# Patient Record
Sex: Female | Born: 1960 | Race: White | Hispanic: No | Marital: Married | Smoking: Former smoker
Health system: Southern US, Community
[De-identification: ages and names within clinical notes are randomized; demographics above are authoritative.]

## PROBLEM LIST (undated history)

## (undated) DIAGNOSIS — M1990 Unspecified osteoarthritis, unspecified site: Secondary | ICD-10-CM

## (undated) DIAGNOSIS — F99 Mental disorder, not otherwise specified: Secondary | ICD-10-CM

## (undated) DIAGNOSIS — R51 Headache: Secondary | ICD-10-CM

## (undated) DIAGNOSIS — K219 Gastro-esophageal reflux disease without esophagitis: Secondary | ICD-10-CM

## (undated) DIAGNOSIS — E119 Type 2 diabetes mellitus without complications: Secondary | ICD-10-CM

## (undated) DIAGNOSIS — F419 Anxiety disorder, unspecified: Secondary | ICD-10-CM

## (undated) DIAGNOSIS — E039 Hypothyroidism, unspecified: Secondary | ICD-10-CM

## (undated) HISTORY — PX: KNEE ARTHROSCOPY: SUR90

## (undated) HISTORY — PX: JOINT REPLACEMENT: SHX530

## (undated) HISTORY — PX: DILATION AND CURETTAGE OF UTERUS: SHX78

## (undated) HISTORY — PX: OTHER SURGICAL HISTORY: SHX169

---

## 2005-04-26 ENCOUNTER — Ambulatory Visit: Payer: Self-pay | Admitting: Obstetrics & Gynecology

## 2005-05-12 ENCOUNTER — Ambulatory Visit: Payer: Self-pay

## 2006-01-15 ENCOUNTER — Emergency Department: Payer: Self-pay | Admitting: Emergency Medicine

## 2006-01-16 ENCOUNTER — Emergency Department: Payer: Self-pay | Admitting: Emergency Medicine

## 2006-04-17 ENCOUNTER — Ambulatory Visit: Payer: Self-pay | Admitting: Internal Medicine

## 2006-11-21 ENCOUNTER — Ambulatory Visit: Payer: Self-pay | Admitting: General Practice

## 2006-12-20 ENCOUNTER — Ambulatory Visit: Payer: Self-pay | Admitting: Orthopaedic Surgery

## 2007-04-17 ENCOUNTER — Ambulatory Visit: Payer: Self-pay | Admitting: Internal Medicine

## 2007-04-18 ENCOUNTER — Ambulatory Visit: Payer: Self-pay | Admitting: Internal Medicine

## 2007-07-06 ENCOUNTER — Emergency Department: Payer: Self-pay | Admitting: Emergency Medicine

## 2007-10-23 ENCOUNTER — Ambulatory Visit: Payer: Self-pay | Admitting: Internal Medicine

## 2008-12-16 ENCOUNTER — Ambulatory Visit: Payer: Self-pay | Admitting: Internal Medicine

## 2009-04-19 ENCOUNTER — Ambulatory Visit: Payer: Self-pay | Admitting: Internal Medicine

## 2010-01-05 ENCOUNTER — Ambulatory Visit: Payer: Self-pay | Admitting: Internal Medicine

## 2010-04-08 ENCOUNTER — Encounter: Admission: RE | Admit: 2010-04-08 | Discharge: 2010-04-08 | Payer: Self-pay | Admitting: Orthopedic Surgery

## 2010-06-26 ENCOUNTER — Emergency Department: Payer: Self-pay | Admitting: Emergency Medicine

## 2010-07-01 ENCOUNTER — Encounter: Admission: RE | Admit: 2010-07-01 | Discharge: 2010-07-01 | Payer: Self-pay | Admitting: Orthopedic Surgery

## 2010-10-24 ENCOUNTER — Ambulatory Visit: Payer: Self-pay | Admitting: Orthopedic Surgery

## 2010-10-31 ENCOUNTER — Inpatient Hospital Stay: Payer: Self-pay | Admitting: Orthopedic Surgery

## 2010-11-01 LAB — PATHOLOGY REPORT

## 2010-11-14 ENCOUNTER — Ambulatory Visit: Payer: Self-pay | Admitting: Internal Medicine

## 2010-12-15 ENCOUNTER — Ambulatory Visit: Payer: Self-pay | Admitting: Orthopedic Surgery

## 2011-07-18 ENCOUNTER — Ambulatory Visit: Payer: Self-pay | Admitting: Internal Medicine

## 2011-10-13 ENCOUNTER — Emergency Department: Payer: Self-pay | Admitting: Emergency Medicine

## 2012-03-13 ENCOUNTER — Ambulatory Visit: Payer: Self-pay

## 2012-03-20 ENCOUNTER — Ambulatory Visit: Payer: Self-pay | Admitting: Surgery

## 2012-05-29 ENCOUNTER — Ambulatory Visit: Payer: Self-pay | Admitting: Internal Medicine

## 2012-05-29 LAB — CBC CANCER CENTER
Basophil: 4 %
HCT: 41 % (ref 35.0–47.0)
MCH: 29.4 pg (ref 26.0–34.0)
MCHC: 32.2 g/dL (ref 32.0–36.0)
MCV: 91 fL (ref 80–100)
Monocytes: 7 %
Platelet: 94 x10 3/mm — ABNORMAL LOW (ref 150–440)
RDW: 14.6 % — ABNORMAL HIGH (ref 11.5–14.5)
WBC: 4.5 x10 3/mm (ref 3.6–11.0)

## 2012-05-29 LAB — URINALYSIS, COMPLETE
Ketone: NEGATIVE
Leukocyte Esterase: NEGATIVE
Nitrite: NEGATIVE
Ph: 5 (ref 4.5–8.0)
Protein: NEGATIVE
RBC,UR: <1 /HPF (ref 0–5)
Specific Gravity: 1.021 (ref 1.003–1.030)
Squamous Epithelial: <1

## 2012-05-29 LAB — SEDIMENTATION RATE: Erythrocyte Sed Rate: 15 mm/hr (ref 0–30)

## 2012-05-29 LAB — IRON AND TIBC
Iron Bind.Cap.(Total): 418 ug/dL (ref 250–450)
Iron Saturation: 18 %
Iron: 77 ug/dL (ref 50–170)
Unbound Iron-Bind.Cap.: 341 ug/dL

## 2012-05-29 LAB — HEPATIC FUNCTION PANEL A (ARMC)
Albumin: 4.3 g/dL (ref 3.4–5.0)
Bilirubin,Total: 0.3 mg/dL (ref 0.2–1.0)
SGOT(AST): 28 U/L (ref 15–37)
SGPT (ALT): 44 U/L
Total Protein: 8 g/dL (ref 6.4–8.2)

## 2012-05-29 LAB — CREATININE, SERUM
EGFR (African American): >60
EGFR (Non-African Amer.): >60

## 2012-05-29 LAB — APTT: Activated PTT: 28.6 secs (ref 23.6–35.9)

## 2012-05-29 LAB — PROTIME-INR
INR: 1
Prothrombin Time: 13.4 secs (ref 11.5–14.7)

## 2012-05-29 LAB — LACTATE DEHYDROGENASE: LDH: 165 U/L (ref 84–246)

## 2012-05-29 LAB — RETICULOCYTES
Absolute Retic Count: 0.0484 10*6/uL (ref 0.024–0.084)
Reticulocyte: 1.08 % (ref 0.5–1.5)

## 2012-05-31 LAB — PROT IMMUNOELECTROPHORES(ARMC)

## 2012-06-05 LAB — CBC CANCER CENTER
Eosinophil: 5 %
HCT: 40.4 % (ref 35.0–47.0)
Lymphocytes: 33 %
MCV: 92 fL (ref 80–100)
Monocytes: 4 %
Platelet: 79 x10 3/mm — ABNORMAL LOW (ref 150–440)
Segmented Neutrophils: 57 %
WBC: 4.1 x10 3/mm (ref 3.6–11.0)

## 2012-06-05 LAB — RETICULOCYTES
Absolute Retic Count: 0.0413 10*6/uL (ref 0.024–0.084)
Reticulocyte: 0.94 % (ref 0.5–1.5)

## 2012-06-27 ENCOUNTER — Ambulatory Visit: Payer: Self-pay | Admitting: Internal Medicine

## 2012-06-28 ENCOUNTER — Ambulatory Visit: Payer: Self-pay | Admitting: Gastroenterology

## 2012-11-25 ENCOUNTER — Encounter (HOSPITAL_COMMUNITY): Payer: Self-pay | Admitting: Pharmacy Technician

## 2012-11-26 DIAGNOSIS — M47812 Spondylosis without myelopathy or radiculopathy, cervical region: Secondary | ICD-10-CM | POA: Diagnosis present

## 2012-11-26 NOTE — H&P (Signed)
History of Present Illness The patient is a 51 year old female who presents today for follow up of their neck. The patient is being followed for their right-sided neck pain. Symptoms reported today include: pain, aching, stiffness and burning. The patient feels that they are doing poorly and report their pain level to be moderate. The following medication has been used for pain control: Percocet. The patient presents today following a nerve block. 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    Subjective Transcription  Dawn Dean presents today forward flexion. The injection that we did in November is beginning to wear off and her quality of life continues to deteriorate. The patient had an excellent response to the recent right C6 nerve root block confirming that this is the pain generator. The MRI done in July confirms the foraminal stenosis and hard disk osteophyte going to the right C5-6 with C6 nerve compression and this is confirmed by the positive result of the injection. The patient has had this issue now for 2-1/2 almost three years. Despite injection therapy, physical therapy, activity modification and medications the patient's quality of life continues to suffer and deteriorate.    Allergies No Known Drug Allergies. 05/21/2012   Medication History Robaxin (500MG  Tablet, 1 (one) Oral qhs, Taken starting 09/03/2012) Active. NexIUM ( Oral) Specific dose unknown - Active. Levothyroxine Sodium ( Oral) Specific dose unknown - Active. Atorvastatin Calcium ( Oral) Specific dose unknown - Active. Citalopram Hydrobromide (40MG  Tablet, Oral) Active. (60mg ) Aleve (220MG  Capsule, Oral) Active. MetFORMIN HCl (1000MG  Tablet, Oral) Active. Xanax (0.5MG  Tablet, Oral) Active. Apidra OptiClik (100UNIT/ML Solution, Subcutaneous) Active. Vitamin B 12 ( Lozenge, Oral)  Active. Percocet (10-325MG  Tablet, Oral) Active. Proventil HFA (108 (90 Base)MCG/ACT Aerosol Soln, Inhalation) Active. Polyethylene Glycol 3350 ( Oral) Active. NexIUM (40MG  Capsule DR, Oral) Active. Levothyroxine Sodium ( Tablet, Oral) Active. Fluconazole (100MG  Tablet, Oral) Active. ALPRAZolam (1MG  Tablet, Oral) Active. Amoxicillin-Pot Clavulanate (875-125MG  Tablet, Oral) Active. [employer] ( In Vitro) Active. Glimepiride (4MG  Tablet, Oral) Active. PEG 3350/Electrolytes (240GM For Solution, Oral) Active.   Objective Transcription  Clinically the patient still has dysesthesias in a C6 distribution on the right. Five out of five motor strength. One plus symmetrical deep tendon reflexes.    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 We discussed treatment plans and she would like to proceed with an ACDF. I think this is the best option given the duration of her symptoms, the failure of conservative management and the confirmatory injection that she had. The risks of surgery include infection, bleeding, nerve damage, death, stroke, paralysis, failure to heal, need for further surgery, ongoing or worse pain,  loss of bowel or bladder control, throat pain, swallowing difficulties, hoarseness in the voice, nonunion meaning it does not fuse, or the levels above and below can break down.    Plans Transcription  At this point, I have gone over the procedure with the patient and she has expressed an understanding of the procedure. All of her questions were addressed. We will plan on proceeding with the ACDF some point in early January.  Patient cleared for surgery medically by Dr Alyson Reedy (PCP)

## 2012-11-28 ENCOUNTER — Ambulatory Visit: Payer: Self-pay | Admitting: Internal Medicine

## 2012-12-04 ENCOUNTER — Encounter (HOSPITAL_COMMUNITY): Payer: Self-pay

## 2012-12-04 ENCOUNTER — Encounter (HOSPITAL_COMMUNITY)
Admission: RE | Admit: 2012-12-04 | Discharge: 2012-12-04 | Disposition: A | Discharge status: Home / Self Care | Payer: Worker's Compensation | Source: Ambulatory Visit | Attending: Anesthesiology | Admitting: Anesthesiology

## 2012-12-04 ENCOUNTER — Encounter (HOSPITAL_COMMUNITY)
Admission: RE | Admit: 2012-12-04 | Discharge: 2012-12-04 | Disposition: A | Discharge status: Home / Self Care | Payer: Worker's Compensation | Source: Ambulatory Visit | Attending: Orthopedic Surgery | Admitting: Orthopedic Surgery

## 2012-12-04 HISTORY — DX: Anxiety disorder, unspecified: F41.9

## 2012-12-04 HISTORY — DX: Hypothyroidism, unspecified: E03.9

## 2012-12-04 HISTORY — DX: Headache: R51

## 2012-12-04 HISTORY — DX: Gastro-esophageal reflux disease without esophagitis: K21.9

## 2012-12-04 HISTORY — DX: Type 2 diabetes mellitus without complications: E11.9

## 2012-12-04 HISTORY — DX: Mental disorder, not otherwise specified: F99

## 2012-12-04 LAB — CBC
MCH: 30.3 pg (ref 26.0–34.0)
MCHC: 33.5 g/dL (ref 30.0–36.0)
MCV: 90.4 fL (ref 78.0–100.0)
Platelets: 113 10*3/uL — ABNORMAL LOW (ref 150–400)
RBC: 4.46 MIL/uL (ref 3.87–5.11)
RDW: 14.1 % (ref 11.5–15.5)
WBC: 6.1 10*3/uL (ref 4.0–10.5)

## 2012-12-04 LAB — BASIC METABOLIC PANEL
BUN: 16 mg/dL (ref 6–23)
GFR calc Af Amer: 56 mL/min — ABNORMAL LOW (ref >90)
GFR calc non Af Amer: 48 mL/min — ABNORMAL LOW (ref >90)
Glucose, Bld: 218 mg/dL — ABNORMAL HIGH (ref 70–99)
Sodium: 137 mEq/L (ref 135–145)

## 2012-12-04 LAB — HCG, SERUM, QUALITATIVE: Preg, Serum: NEGATIVE

## 2012-12-04 MED ORDER — CEFAZOLIN SODIUM-DEXTROSE 2-3 GM-% IV SOLR
2.0000 g | INTRAVENOUS | Status: AC
  Administered 2012-12-05: 2 g via INTRAVENOUS
  Filled 2012-12-04: qty 50

## 2012-12-04 MED ORDER — ACETAMINOPHEN 10 MG/ML IV SOLN
1000.0000 mg | Freq: Once | INTRAVENOUS | Status: DC
  Filled 2012-12-04: qty 100

## 2012-12-04 MED ORDER — DEXAMETHASONE SODIUM PHOSPHATE 4 MG/ML IJ SOLN
4.0000 mg | Freq: Once | INTRAMUSCULAR | Status: DC
  Filled 2012-12-04: qty 1

## 2012-12-04 NOTE — Progress Notes (Signed)
Spoke with Dr. Noreene Larsson, regarding pt. EKG (prelim. Result) done today.  We are unsure where she had or if she had last ekg.  Dr. Noreene Larsson  will review pt. In a.m.

## 2012-12-04 NOTE — Pre-Procedure Instructions (Signed)
18 Dawn Dean  12/04/2012   Your procedure is scheduled on:  Thursday, January 9th  Report to [facility] at 0530 AM.  Call this number if you have problems the morning of surgery: [phone]   Remember:   Do not eat food or drink:After Midnight.   Take these medicines the morning of surgery with A SIP OF WATER: celexa, nexium, ativan if needed, percocet if needed   Do not wear jewelry, make-up or nail polish.  Do not wear lotions, powders, or perfumes. You may not wear deodorant.  Do not shave 48 hours prior to surgery. Men may shave face and neck.  Do not bring valuables to the hospital.  Contacts, dentures or bridgework may not be worn into surgery.  Leave suitcase in the car. After surgery it may be brought to your room.  For patients admitted to the hospital, checkout time is 11:00 AM the day of discharge.   Patients discharged the day of surgery will not be allowed to drive home.   Special Instructions: Shower using CHG 2 nights before surgery and the night before surgery.  If you shower the day of surgery use CHG.  Use special wash - you have one bottle of CHG for all showers.  You should use approximately 1/3 of the bottle for each shower.   Please read over the following fact sheets that you were given: Pain Booklet, Coughing and Deep Breathing, MRSA Information and Surgical Site Infection Prevention

## 2012-12-05 ENCOUNTER — Ambulatory Visit (HOSPITAL_COMMUNITY): Payer: Worker's Compensation

## 2012-12-05 ENCOUNTER — Ambulatory Visit (HOSPITAL_COMMUNITY): Payer: Worker's Compensation | Admitting: Anesthesiology

## 2012-12-05 ENCOUNTER — Encounter (HOSPITAL_COMMUNITY): Payer: Self-pay | Admitting: General Practice

## 2012-12-05 ENCOUNTER — Ambulatory Visit (HOSPITAL_COMMUNITY)
Admission: RE | Admit: 2012-12-05 | Discharge: 2012-12-06 | Disposition: A | Discharge status: Home / Self Care | Payer: Worker's Compensation | Source: Ambulatory Visit | Attending: Orthopedic Surgery | Admitting: Orthopedic Surgery

## 2012-12-05 ENCOUNTER — Encounter (HOSPITAL_COMMUNITY): Payer: Self-pay | Admitting: Anesthesiology

## 2012-12-05 ENCOUNTER — Encounter (HOSPITAL_COMMUNITY)
Admission: RE | Disposition: A | Discharge status: Home / Self Care | Payer: Self-pay | Source: Ambulatory Visit | Attending: Orthopedic Surgery

## 2012-12-05 ENCOUNTER — Encounter (HOSPITAL_COMMUNITY): Payer: Self-pay | Admitting: *Deleted

## 2012-12-05 DIAGNOSIS — Z01812 Encounter for preprocedural laboratory examination: Secondary | ICD-10-CM | POA: Insufficient documentation

## 2012-12-05 DIAGNOSIS — E119 Type 2 diabetes mellitus without complications: Secondary | ICD-10-CM | POA: Insufficient documentation

## 2012-12-05 DIAGNOSIS — Z794 Long term (current) use of insulin: Secondary | ICD-10-CM | POA: Insufficient documentation

## 2012-12-05 DIAGNOSIS — M47812 Spondylosis without myelopathy or radiculopathy, cervical region: Secondary | ICD-10-CM | POA: Insufficient documentation

## 2012-12-05 HISTORY — PX: CERVICAL DISCECTOMY: SHX98

## 2012-12-05 HISTORY — PX: ANTERIOR CERVICAL DECOMP/DISCECTOMY FUSION: SHX1161

## 2012-12-05 LAB — GLUCOSE, CAPILLARY
Glucose-Capillary: 145 mg/dL — ABNORMAL HIGH (ref 70–99)
Glucose-Capillary: 189 mg/dL — ABNORMAL HIGH (ref 70–99)
Glucose-Capillary: 247 mg/dL — ABNORMAL HIGH (ref 70–99)

## 2012-12-05 SURGERY — ANTERIOR CERVICAL DECOMPRESSION/DISCECTOMY FUSION 1 LEVEL
Anesthesia: General | Site: Spine Cervical | Wound class: Clean

## 2012-12-05 MED ORDER — SODIUM CHLORIDE 0.9 % IJ SOLN: 3.0000 mL | INTRAMUSCULAR | Status: DC | PRN

## 2012-12-05 MED ORDER — LIDOCAINE HCL 4 % MT SOLN
OROMUCOSAL | Status: DC | PRN
  Administered 2012-12-05: 4 mL via TOPICAL

## 2012-12-05 MED ORDER — LACTATED RINGERS IV SOLN
INTRAVENOUS | Status: DC | PRN
  Administered 2012-12-05 (×2): via INTRAVENOUS

## 2012-12-05 MED ORDER — THROMBIN 20000 UNITS EX SOLR
CUTANEOUS | Status: DC | PRN
  Administered 2012-12-05: 09:00:00 via TOPICAL

## 2012-12-05 MED ORDER — SODIUM CHLORIDE 0.9 % IV SOLN: 250.0000 mL | INTRAVENOUS | Status: DC

## 2012-12-05 MED ORDER — PROPOFOL 10 MG/ML IV BOLUS
INTRAVENOUS | Status: DC | PRN
  Administered 2012-12-05: 160 mg via INTRAVENOUS

## 2012-12-05 MED ORDER — ACETAMINOPHEN 10 MG/ML IV SOLN
1000.0000 mg | Freq: Four times a day (QID) | INTRAVENOUS | Status: AC
  Administered 2012-12-05 – 2012-12-06 (×4): 1000 mg via INTRAVENOUS
  Filled 2012-12-05 (×4): qty 100

## 2012-12-05 MED ORDER — OXYCODONE HCL 5 MG PO TABS
10.0000 mg | ORAL_TABLET | ORAL | Status: DC | PRN
  Administered 2012-12-05 – 2012-12-06 (×3): 10 mg via ORAL
  Filled 2012-12-05 (×3): qty 2

## 2012-12-05 MED ORDER — THROMBIN 20000 UNITS EX SOLR
CUTANEOUS | Status: DC | PRN
  Administered 2012-12-05: 20000 [IU] via TOPICAL

## 2012-12-05 MED ORDER — FENTANYL CITRATE 0.05 MG/ML IJ SOLN
INTRAMUSCULAR | Status: DC | PRN
  Administered 2012-12-05: 100 ug via INTRAVENOUS
  Administered 2012-12-05 (×3): 50 ug via INTRAVENOUS

## 2012-12-05 MED ORDER — HYDROMORPHONE HCL PF 1 MG/ML IJ SOLN
INTRAMUSCULAR | Status: AC
  Filled 2012-12-05: qty 1

## 2012-12-05 MED ORDER — GLYCOPYRROLATE 0.2 MG/ML IJ SOLN
INTRAMUSCULAR | Status: DC | PRN
  Administered 2012-12-05: 0.4 mg via INTRAVENOUS

## 2012-12-05 MED ORDER — METHOCARBAMOL 100 MG/ML IJ SOLN
500.0000 mg | Freq: Four times a day (QID) | INTRAVENOUS | Status: DC | PRN
  Administered 2012-12-05: 500 mg via INTRAVENOUS
  Filled 2012-12-05: qty 5

## 2012-12-05 MED ORDER — HYDROMORPHONE HCL PF 1 MG/ML IJ SOLN
0.2500 mg | INTRAMUSCULAR | Status: DC | PRN
Start: 2012-12-05 — End: 2012-12-05
  Administered 2012-12-05 (×2): 0.5 mg via INTRAVENOUS

## 2012-12-05 MED ORDER — DEXAMETHASONE SODIUM PHOSPHATE 10 MG/ML IJ SOLN
INTRAMUSCULAR | Status: AC
  Filled 2012-12-05: qty 1

## 2012-12-05 MED ORDER — INSULIN ASPART 100 UNIT/ML ~~LOC~~ SOLN
0.0000 [IU] | SUBCUTANEOUS | Status: DC
  Administered 2012-12-05: 5 [IU] via SUBCUTANEOUS
  Administered 2012-12-05 – 2012-12-06 (×2): 3 [IU] via SUBCUTANEOUS

## 2012-12-05 MED ORDER — CEFAZOLIN SODIUM 1-5 GM-% IV SOLN
1.0000 g | Freq: Three times a day (TID) | INTRAVENOUS | Status: AC
  Administered 2012-12-05 – 2012-12-06 (×2): 1 g via INTRAVENOUS
  Filled 2012-12-05 (×2): qty 50

## 2012-12-05 MED ORDER — THROMBIN 20000 UNITS EX SOLR: CUTANEOUS | Status: DC | PRN

## 2012-12-05 MED ORDER — PHENOL 1.4 % MT LIQD
1.0000 | OROMUCOSAL | Status: DC | PRN
  Administered 2012-12-05: 1 via OROMUCOSAL
  Filled 2012-12-05: qty 177

## 2012-12-05 MED ORDER — THROMBIN 20000 UNITS EX SOLR
CUTANEOUS | Status: AC
  Filled 2012-12-05: qty 20000

## 2012-12-05 MED ORDER — HEMOSTATIC AGENTS (NO CHARGE) OPTIME
TOPICAL | Status: DC | PRN
  Administered 2012-12-05: 1 via TOPICAL

## 2012-12-05 MED ORDER — ACETAMINOPHEN 10 MG/ML IV SOLN
INTRAVENOUS | Status: AC
  Administered 2012-12-05: 1000 mg via INTRAVENOUS
  Filled 2012-12-05: qty 100

## 2012-12-05 MED ORDER — MORPHINE SULFATE 2 MG/ML IJ SOLN
1.0000 mg | INTRAMUSCULAR | Status: DC | PRN
  Administered 2012-12-05 (×3): 2 mg via INTRAVENOUS
  Filled 2012-12-05 (×2): qty 1

## 2012-12-05 MED ORDER — MORPHINE SULFATE 2 MG/ML IJ SOLN
INTRAMUSCULAR | Status: AC
  Filled 2012-12-05: qty 1

## 2012-12-05 MED ORDER — LORAZEPAM 1 MG PO TABS
1.0000 mg | ORAL_TABLET | Freq: Four times a day (QID) | ORAL | Status: DC | PRN
  Administered 2012-12-05: 1 mg via ORAL
  Filled 2012-12-05: qty 1

## 2012-12-05 MED ORDER — ONDANSETRON HCL 4 MG/2ML IJ SOLN
INTRAMUSCULAR | Status: DC | PRN
  Administered 2012-12-05: 4 mg via INTRAVENOUS

## 2012-12-05 MED ORDER — BUPIVACAINE-EPINEPHRINE 0.25% -1:200000 IJ SOLN
INTRAMUSCULAR | Status: AC
  Filled 2012-12-05: qty 1

## 2012-12-05 MED ORDER — MIDAZOLAM HCL 5 MG/5ML IJ SOLN
INTRAMUSCULAR | Status: DC | PRN
  Administered 2012-12-05: 2 mg via INTRAVENOUS

## 2012-12-05 MED ORDER — MENTHOL 3 MG MT LOZG: 1.0000 | LOZENGE | OROMUCOSAL | Status: DC | PRN

## 2012-12-05 MED ORDER — LACTATED RINGERS IV SOLN: INTRAVENOUS | Status: DC

## 2012-12-05 MED ORDER — BUPIVACAINE-EPINEPHRINE 0.25% -1:200000 IJ SOLN
INTRAMUSCULAR | Status: DC | PRN
  Administered 2012-12-05: 4 mL

## 2012-12-05 MED ORDER — NEOSTIGMINE METHYLSULFATE 1 MG/ML IJ SOLN
INTRAMUSCULAR | Status: DC | PRN
  Administered 2012-12-05: 3 mg via INTRAVENOUS

## 2012-12-05 MED ORDER — ROCURONIUM BROMIDE 100 MG/10ML IV SOLN
INTRAVENOUS | Status: DC | PRN
  Administered 2012-12-05: 50 mg via INTRAVENOUS
  Administered 2012-12-05: 30 mg via INTRAVENOUS

## 2012-12-05 MED ORDER — LIDOCAINE HCL (CARDIAC) 20 MG/ML IV SOLN
INTRAVENOUS | Status: DC | PRN
  Administered 2012-12-05: 50 mg via INTRAVENOUS

## 2012-12-05 MED ORDER — ONDANSETRON HCL 4 MG/2ML IJ SOLN: 4.0000 mg | INTRAMUSCULAR | Status: DC | PRN

## 2012-12-05 MED ORDER — SODIUM CHLORIDE 0.9 % IJ SOLN
3.0000 mL | Freq: Two times a day (BID) | INTRAMUSCULAR | Status: DC
  Administered 2012-12-05: 3 mL via INTRAVENOUS

## 2012-12-05 MED ORDER — ACETAMINOPHEN 10 MG/ML IV SOLN: 1000.0000 mg | Freq: Once | INTRAVENOUS | Status: DC | PRN

## 2012-12-05 MED ORDER — ZOLPIDEM TARTRATE 5 MG PO TABS: 5.0000 mg | ORAL_TABLET | Freq: Every evening | ORAL | Status: DC | PRN

## 2012-12-05 MED ORDER — METHOCARBAMOL 500 MG PO TABS
500.0000 mg | ORAL_TABLET | Freq: Four times a day (QID) | ORAL | Status: DC | PRN
  Administered 2012-12-06: 500 mg via ORAL
  Filled 2012-12-05 (×2): qty 1

## 2012-12-05 MED ORDER — ONDANSETRON HCL 4 MG/2ML IJ SOLN: 4.0000 mg | Freq: Once | INTRAMUSCULAR | Status: DC | PRN

## 2012-12-05 SURGICAL SUPPLY — 67 items
BENZOIN TINCTURE PRP APPL 2/3 (GAUZE/BANDAGES/DRESSINGS) ×2 IMPLANT
BLADE SURG ROTATE 9660 (MISCELLANEOUS) IMPLANT
BUR EGG ELITE 4.0 (BURR) IMPLANT
BUR MATCHSTICK NEURO 3.0 LAGG (BURR) IMPLANT
CANISTER SUCTION 2500CC (MISCELLANEOUS) ×2 IMPLANT
CLOTH BEACON ORANGE TIMEOUT ST (SAFETY) ×2 IMPLANT
CLSR STERI-STRIP ANTIMIC 1/2X4 (GAUZE/BANDAGES/DRESSINGS) ×2 IMPLANT
CORDS BIPOLAR (ELECTRODE) ×2 IMPLANT
COVER SURGICAL LIGHT HANDLE (MISCELLANEOUS) ×4 IMPLANT
CRADLE DONUT ADULT HEAD (MISCELLANEOUS) ×2 IMPLANT
DERMABOND ADVANCED (GAUZE/BANDAGES/DRESSINGS) ×1
DERMABOND ADVANCED .7 DNX12 (GAUZE/BANDAGES/DRESSINGS) ×1 IMPLANT
DRAPE C-ARM 42X72 X-RAY (DRAPES) ×2 IMPLANT
DRAPE POUCH INSTRU U-SHP 10X18 (DRAPES) ×2 IMPLANT
DRAPE SURG 17X23 STRL (DRAPES) ×2 IMPLANT
DRAPE U-SHAPE 47X51 STRL (DRAPES) ×2 IMPLANT
DRILL BIT (BIT) ×2 IMPLANT
DRSG MEPILEX BORDER 4X4 (GAUZE/BANDAGES/DRESSINGS) ×2 IMPLANT
DURAPREP 26ML APPLICATOR (WOUND CARE) ×2 IMPLANT
ELECT COATED BLADE 2.86 ST (ELECTRODE) ×2 IMPLANT
ELECT REM PT RETURN 9FT ADLT (ELECTROSURGICAL) ×2
ELECTRODE REM PT RTRN 9FT ADLT (ELECTROSURGICAL) ×1 IMPLANT
GLOVE BIOGEL PI IND STRL 6.5 (GLOVE) ×1 IMPLANT
GLOVE BIOGEL PI IND STRL 7.0 (GLOVE) ×3 IMPLANT
GLOVE BIOGEL PI IND STRL 8.5 (GLOVE) ×1 IMPLANT
GLOVE BIOGEL PI INDICATOR 6.5 (GLOVE) ×1
GLOVE BIOGEL PI INDICATOR 7.0 (GLOVE) ×3
GLOVE BIOGEL PI INDICATOR 8.5 (GLOVE) ×1
GLOVE ECLIPSE 6.0 STRL STRAW (GLOVE) ×2 IMPLANT
GLOVE ECLIPSE 6.5 STRL STRAW (GLOVE) ×4 IMPLANT
GLOVE ECLIPSE 8.5 STRL (GLOVE) ×2 IMPLANT
GLOVE SS BIOGEL STRL SZ 7.5 (GLOVE) ×1 IMPLANT
GLOVE SUPERSENSE BIOGEL SZ 7.5 (GLOVE) ×1
GOWN PREVENTION PLUS XXLARGE (GOWN DISPOSABLE) ×2 IMPLANT
GOWN STRL NON-REIN LRG LVL3 (GOWN DISPOSABLE) ×4 IMPLANT
INTERLOCK LRDTC CRVCL VBR 7MM (Bone Implant) ×1 IMPLANT
KIT BASIN OR (CUSTOM PROCEDURE TRAY) ×2 IMPLANT
KIT ROOM TURNOVER OR (KITS) ×2 IMPLANT
LORDOTIC CERVICAL VBR 7MM SM (Bone Implant) ×2 IMPLANT
NEEDLE SPNL 18GX3.5 QUINCKE PK (NEEDLE) ×2 IMPLANT
NS IRRIG 1000ML POUR BTL (IV SOLUTION) ×2 IMPLANT
PACK ORTHO CERVICAL (CUSTOM PROCEDURE TRAY) ×2 IMPLANT
PACK UNIVERSAL I (CUSTOM PROCEDURE TRAY) ×2 IMPLANT
PAD ARMBOARD 7.5X6 YLW CONV (MISCELLANEOUS) ×4 IMPLANT
PATTIES SURGICAL .25X.25 (GAUZE/BANDAGES/DRESSINGS) IMPLANT
PIN DISTRACTION 14 (PIN) ×4 IMPLANT
PLATE LEVEL 1 TI VECTRA 14MM (Plate) ×2 IMPLANT
PUTTY BONE DBX 2.5 MIS (Bone Implant) ×2 IMPLANT
SCREW 4.0X14MM (Screw) ×3 IMPLANT
SCREW 4.0X16MM (Screw) ×2 IMPLANT
SCREW BN 14X4XSLF DRL VA SLF (Screw) ×3 IMPLANT
SPONGE INTESTINAL PEANUT (DISPOSABLE) IMPLANT
SPONGE SURGIFOAM ABS GEL 100 (HEMOSTASIS) ×2 IMPLANT
SURGIFLO TRUKIT (HEMOSTASIS) IMPLANT
SURGIFLO W/THROMBIN 8M KIT (HEMOSTASIS) ×4 IMPLANT
SUT MNCRL AB 3-0 PS2 18 (SUTURE) ×2 IMPLANT
SUT SILK 2 0 (SUTURE) ×1
SUT SILK 2-0 18XBRD TIE 12 (SUTURE) ×1 IMPLANT
SUT VIC AB 2-0 CT1 18 (SUTURE) ×2 IMPLANT
SYR BULB IRRIGATION 50ML (SYRINGE) ×2 IMPLANT
SYR CONTROL 10ML LL (SYRINGE) IMPLANT
TAPE CLOTH 4X10 WHT NS (GAUZE/BANDAGES/DRESSINGS) ×2 IMPLANT
TAPE UMBILICAL COTTON 1/8X30 (MISCELLANEOUS) ×2 IMPLANT
TOWEL OR 17X24 6PK STRL BLUE (TOWEL DISPOSABLE) ×2 IMPLANT
TOWEL OR 17X26 10 PK STRL BLUE (TOWEL DISPOSABLE) ×2 IMPLANT
WATER STERILE IRR 1000ML POUR (IV SOLUTION) ×2 IMPLANT
YANKAUER SUCT BULB TIP NO VENT (SUCTIONS) ×2 IMPLANT

## 2012-12-05 NOTE — Anesthesia Preprocedure Evaluation (Addendum)
Anesthesia Evaluation  Patient identified by MRN, date of birth, ID band Patient awake    Reviewed: Allergy & Precautions, H&P , NPO status , Patient's Chart, lab work & pertinent test results  Airway Mallampati: II      Dental  (+) Teeth Intact and Dental Advisory Given   Pulmonary former smoker,  breath sounds clear to auscultation        Cardiovascular Rhythm:Regular Rate:Normal     Neuro/Psych  Headaches,    GI/Hepatic GERD-  Medicated and Controlled,  Endo/Other  diabetes, Type 2, Oral Hypoglycemic Agents and Insulin DependentHypothyroidism   Renal/GU      Musculoskeletal   Abdominal   Peds  Hematology   Anesthesia Other Findings   Reproductive/Obstetrics                         Anesthesia Physical Anesthesia Plan  ASA: III  Anesthesia Plan: General   Post-op Pain Management:    Induction: Intravenous  Airway Management Planned: Oral ETT  Additional Equipment:   Intra-op Plan:   Post-operative Plan: Extubation in OR  Informed Consent: I have reviewed the patients History and Physical, chart, labs and discussed the procedure including the risks, benefits and alternatives for the proposed anesthesia with the patient or authorized representative who has indicated his/her understanding and acceptance.   Dental advisory given  Plan Discussed with: CRNA and Anesthesiologist  Anesthesia Plan Comments: (HNP C5-6 Type 2 DM glucose 154 GERD Hypothyroidism  Kipp Brood, MD)       Anesthesia Quick Evaluation

## 2012-12-05 NOTE — Preoperative (Signed)
Beta Blockers   Reason not to administer Beta Blockers:Not Applicable 

## 2012-12-05 NOTE — Brief Op Note (Signed)
12/05/2012  10:10 AM  PATIENT:  Dawn Dean  52 y.o. female  PRE-OPERATIVE DIAGNOSIS:  CERVICAL SPONDILITIC RADICULOPATHY  POST-OPERATIVE DIAGNOSIS:  CERVICAL SPONDILITIC RADICULOPATHY  PROCEDURE:  Procedure(s) (LRB) with comments: ANTERIOR CERVICAL DECOMPRESSION/DISCECTOMY FUSION 1 LEVEL ([name]) - ACDF C5-6  SURGEON:  Surgeon(s) and Role:    * Venita Lick, MD - Primary  PHYSICIAN ASSISTANT:   ASSISTANTS: none   ANESTHESIA:   general  EBL:  Total I/O In: 1000 [I.V.:1000] Out: -   BLOOD ADMINISTERED:none  DRAINS: none   LOCAL MEDICATIONS USED:  MARCAINE     SPECIMEN:  No Specimen  DISPOSITION OF SPECIMEN:  [name]  COUNTS:  YES  TOURNIQUET:  * No tourniquets in log *  DICTATION: .Other Dictation: Dictation Number [id number]  PLAN OF CARE: Admit for overnight observation  PATIENT DISPOSITION:  PACU - hemodynamically stable.

## 2012-12-05 NOTE — Plan of Care (Signed)
Problem: Consults Goal: Diagnosis - Spinal Surgery Cervical Spine Fusion ACDF C5-6

## 2012-12-05 NOTE — Transfer of Care (Signed)
Immediate Anesthesia Transfer of Care Note  Patient: Dawn Dean  Procedure(s) Performed: Procedure(s) (LRB) with comments: ANTERIOR CERVICAL DECOMPRESSION/DISCECTOMY FUSION 1 LEVEL ([name]) - ACDF C5-6  Patient Location: PACU  Anesthesia Type:General  Level of Consciousness: awake, alert  and oriented  Airway & Oxygen Therapy: Patient Spontanous Breathing and Patient connected to nasal cannula oxygen  Post-op Assessment: Report given to PACU RN, Post -op Vital signs reviewed and stable and Patient moving all extremities X 4  Post vital signs: Reviewed and stable  Complications: No apparent anesthesia complications

## 2012-12-05 NOTE — Progress Notes (Signed)
Orthopedic Tech Progress Note Patient Details:  Dawn Dean 1961-06-18 [id number] Patient was issued brace out of surgery Patient ID: Dawn Dean, female   DOB: 1961/03/21, 52 y.o.   MRN: [id number]   Dawn Dean 12/05/2012, 1:45 PM

## 2012-12-05 NOTE — Anesthesia Postprocedure Evaluation (Signed)
  Anesthesia Post-op Note  Patient: Dawn Dean  Procedure(s) Performed: Procedure(s) (LRB) with comments: ANTERIOR CERVICAL DECOMPRESSION/DISCECTOMY FUSION 1 LEVEL ([name]) - ACDF C5-6  Patient Location: PACU  Anesthesia Type:General  Level of Consciousness: awake, alert  and oriented  Airway and Oxygen Therapy: Patient Spontanous Breathing and Patient connected to nasal cannula oxygen  Post-op Pain: mild  Post-op Assessment: Post-op Vital signs reviewed, Patient's Cardiovascular Status Stable, Respiratory Function Stable, Patent Airway, No signs of Nausea or vomiting and Pain level controlled  Post-op Vital Signs: stable  Complications: No apparent anesthesia complications

## 2012-12-05 NOTE — Anesthesia Procedure Notes (Signed)
Procedure Name: Intubation Date/Time: 12/05/2012 7:39 AM Performed by: Quentin Ore Pre-anesthesia Checklist: Patient identified, Emergency Drugs available, Suction available, Patient being monitored and Timeout performed Patient Re-evaluated:Patient Re-evaluated prior to inductionOxygen Delivery Method: Circle system utilized Preoxygenation: Pre-oxygenation with 100% oxygen Intubation Type: IV induction Ventilation: Mask ventilation without difficulty and Oral airway inserted - appropriate to patient size Laryngoscope Size: Mac and 3 Grade View: Grade I Tube type: Oral Tube size: 7.5 mm Number of attempts: 1 Airway Equipment and Method: Stylet and LTA kit utilized Placement Confirmation: ETT inserted through vocal cords under direct vision,  positive ETCO2 and breath sounds checked- equal and bilateral Secured at: 21 cm Tube secured with: Tape Dental Injury: Teeth and Oropharynx as per pre-operative assessment

## 2012-12-05 NOTE — H&P (Signed)
H+P reviewed  No change in clinical exam 

## 2012-12-06 ENCOUNTER — Encounter (HOSPITAL_COMMUNITY): Payer: Self-pay | Admitting: Orthopedic Surgery

## 2012-12-06 MED ORDER — METHOCARBAMOL 500 MG PO TABS: 500.0000 mg | ORAL_TABLET | Freq: Three times a day (TID) | ORAL | Status: DC | PRN

## 2012-12-06 MED ORDER — POLYETHYLENE GLYCOL 3350 17 GM/SCOOP PO POWD: 17.0000 g | Freq: Every day | ORAL | Status: DC

## 2012-12-06 MED ORDER — ONDANSETRON HCL 4 MG PO TABS: 4.0000 mg | ORAL_TABLET | Freq: Three times a day (TID) | ORAL | Status: DC | PRN

## 2012-12-06 MED ORDER — DOCUSATE SODIUM 100 MG PO CAPS: 100.0000 mg | ORAL_CAPSULE | Freq: Three times a day (TID) | ORAL | Status: DC | PRN

## 2012-12-06 MED ORDER — OXYCODONE-ACETAMINOPHEN 10-325 MG PO TABS: 1.0000 | ORAL_TABLET | ORAL | Status: DC | PRN

## 2012-12-06 NOTE — Op Note (Signed)
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  OPERATIVE PROCEDURE:  Anterior cervical diskectomy and fusion, C5-6.  INSTRUMENTATION SYSTEM USED: 1. Titan titanium intervertebral spacer, size 7 lordotic small packed     with DBX mix. 2. Application of anterior cervical Synthes Vectra plate, 14 mm with     appropriate locking screws.  COMPLICATIONS:  None.  CONDITION:  Stable.  HISTORY:  This is a very pleasant woman who presented to my office with longstanding neck and radicular right arm pain.  Attempts at conservative management has failed to alleviate her symptoms.  A selective C6 nerve root block provided the only relief.  As a result, we elected to proceed with surgery.  Since she only had clinically C6 radicular pain and no other clinical symptoms and she improved with the C6 selective nerve root block, we elected only to do the 1-level even of the other levels did show degenerative changes.  All appropriate risks, benefits, and alternatives were discussed with the patient and consent was obtained.  OPERATIVE NOTE:  The patient was brought to the operating room and placed supine on the operating table.  After successful induction of general anesthesia and endotracheal intubation, TEDs and SCDs were applied.  The patient was placed supine on the operative table.  Towels were placed between the shoulder blades and the anterior cervical spine was prepped and draped in the usual standard fashion.  Time-out was done confirming patient, procedure, and all  other pertinent important data. Once this was completed, x-ray was brought into the field to identify the C5-6 disk space level.  Incision site was marked out and then infiltrated with 0.25% Marcaine with epi.  An incision was made starting at the midline and proceeding to the left in a transverse fashion centered over the C5-6 disk space.  Sharp dissection was carried out down to the platysma and the platysma was sharply incised.  I then identified the medial border of the sternocleidomastoid and using a standard Smith-Robinson approach to the anterior cervical spine, utilized the inner plane between the sternocleidomastoid and the omohyoid and the strap muscles.  I dissected sharply through the deep cervical and prevertebral fascia until I could palpate the anterior cervical spine.  I protected the trachea and esophagus medially with my finger and the carotid sheath laterally with my finger and then placed a retractor to retract the trachea and esophagus.  I then used a Pension scheme manager to completely mobilize the prevertebral fascia to expose the anterior longitudinal ligament.  A needle was placed into the C5-6 disk space and 2nd x-ray was taken to confirm that I was at the appropriate level.  Once this was done, I then mobilized the longus colli muscles with bipolar electrocautery, and completely exposed the C5-6 disk space. I then placed self-retaining  retractors underneath the longus colli muscle, deflated the endotracheal cuff, expanded the retractors, and then reinflated the endotracheal cuff.  An annulotomy was performed with a 15-blade scalpel and then using a combination of pituitary rongeurs, curettes, and Kerrison rongeurs, I removed all the disk material.  Using a 2-mm Kerrison, I removed the overhanging osteophyte from the inferior aspect of the C5 vertebral body.  Once I had the majority of the disk material out, I then placed distraction pins into the bodies of C5 and  C6.  I placed distracting device in between the vertebral bodies and distracted them and then held the distraction with the pins.  I continued my decompression removing all the disk material until I got down to the posterior longitudinal ligament.  I then used a fine nerve hook to develop a plane underneath the posterior longitudinal ligament.  I then used my 1 mm Kerrison to resect the posterior longitudinal ligament and expose the thecal sac.  I then went out underneath the uncovertebral joints and removed the bone spur.  Once I had a complete decompression, I then rasped the endplates to bleeding subchondral bone.  I then used trial devices and elected to use 7 small interbody spacer that provided the greatest best fit.  I then obtained the implant, packed it with DBX mix, and malleted it to the appropriate resting position.  It was confirmed with x-ray.  I then placed a 14 mm anterior cervical Vectra plate and secured it with 14 mm screws into the body of C6 and one 16 and one 14 mm screw to the body of C5.  All screws were properly torqued down, and locked to the plate.  I had excellent fixation of all 4 screws.  I irrigated the wound again, removed the distraction pins and the self-retaining retractors, and returned the trachea and esophagus to midline after checking to make sure they were not entrapped beneath the plate.  I then closed the platysma with interrupted 2-0 Vicryl sutures and 3-0 Monocryl for the skin.  Steri-Strips, dry dressing, and an Aspen collar were applied. The patient was extubated, transferred to PACU without incident.  At the end of the case, all needle and sponge counts were correct.  There was no adverse intraoperative events.     Alvy Beal, MD     DDB/MEDQ  D:  12/05/2012  T:  12/06/2012  Job:  [id number]

## 2012-12-06 NOTE — Progress Notes (Signed)
    Subjective: Procedure(s) (LRB): ANTERIOR CERVICAL DECOMPRESSION/DISCECTOMY FUSION 1 LEVEL ([name]) 1 Day Post-Op  Patient reports pain as 2 on 0-10 scale.  Reports decreased arm pain denies incisional neck pain   Positive void Negative bowel movement Positive flatus Negative chest pain or shortness of breath  Objective: Vital signs in last 24 hours: Temp:  [97.2 F (36.2 C)-98.9 F (37.2 C)] 98.9 F (37.2 C) (01/09 2124) Pulse Rate:  [79-90] 89  (01/09 2124) Resp:  [11-17] 16  (01/09 2124) BP: (144-169)/(76-82) 144/76 mmHg (01/09 2124) SpO2:  [94 %-100 %] 94 % (01/09 2124)  Intake/Output from previous day: 01/09 0701 - 01/10 0700 In: 1240 [P.O.:40; I.V.:1200] Out: 30 [Blood:30]  Labs:  [facility] 12/04/12 1630  WBC 6.1  RBC 4.46  HCT 40.3  PLT 113*    Basename 12/04/12 1630  NA 137  K 4.6  CL 99  CO2 30  BUN 16  CREATININE 1.25*  GLUCOSE 218*  CALCIUM 10.1   No results found for this basename: LABPT:2,INR:2 in the last 72 hours  Physical Exam: Neurologically intact ABD soft Neurovascular intact Intact pulses distally Incision: dressing C/D/I and no drainage No cellulitis present no swelling or tenderness at incision site  Assessment/Plan: Patient stable  xrays satisfactory hardware position s/p ACDF C5-6 Mobilization with physical therapy Encourage incentive spirometry Continue care  Advance diet Up with therapy DOing well  Ambulating  Plan on d/c to home today  Venita Lick, MD [facility] [phone]

## 2012-12-06 NOTE — Discharge Summary (Signed)
Patient ID: Dawn Dean MRN: [id number] DOB/AGE: January 12, 1961 52 y.o.  Admit date: 12/05/2012 Discharge date: 12/06/2012  Admission Diagnoses:  Principal Problem:  *Cervical spondylosis   Discharge Diagnoses:  Principal Problem:  *Cervical spondylosis  status post Procedure(s): ANTERIOR CERVICAL DECOMPRESSION/DISCECTOMY FUSION 1 LEVEL  Past Medical History  Diagnosis Date  . Hypothyroidism   . Anxiety   . Mental disorder   . Diabetes mellitus without complication     diagnosed -20 yrs. ago   . GERD (gastroesophageal reflux disease)   . Headache     Surgeries: Procedure(s): ANTERIOR CERVICAL DECOMPRESSION/DISCECTOMY FUSION 1 LEVEL on 12/05/2012   Consultants:  none  Discharged Condition: Improved  Hospital Course: Dawn Dean is an 52 y.o. female who was admitted 12/05/2012 for operative treatment of Cervical spondylosis. Patient failed conservative treatments (please see the history and physical for the specifics) and had severe unremitting pain that affects sleep, daily activities and work/hobbies. After pre-op clearance, the patient was taken to the operating room on 12/05/2012 and underwent  Procedure(s): ANTERIOR CERVICAL DECOMPRESSION/DISCECTOMY FUSION 1 LEVEL.    Patient was given perioperative antibiotics: Anti-infectives     Start     Dose/Rate Route Frequency Ordered Stop   12/05/12 1530   ceFAZolin (ANCEF) IVPB 1 g/50 mL premix        1 g 100 mL/hr over 30 Minutes Intravenous Every 8 hours 12/05/12 1313 12/06/12 0032   12/04/12 1428   ceFAZolin (ANCEF) IVPB 2 g/50 mL premix        2 g 100 mL/hr over 30 Minutes Intravenous 30 min pre-op 12/04/12 1428 12/05/12 0745           Patient was given sequential compression devices and early ambulation to prevent DVT.   Patient benefited maximally from hospital stay and there were no complications. At the time of discharge, the patient was urinating/moving their bowels without difficulty, tolerating a  regular diet, pain is controlled with oral pain medications and they have been cleared by PT/OT.   Recent vital signs: Patient Vitals for the past 24 hrs:  BP Temp Pulse Resp SpO2  12/05/12 2124 144/76 mmHg 98.9 F (37.2 C) 89  16  94 %  12/05/12 1230 150/81 mmHg 98.4 F (36.9 C) 87  15  97 %  12/05/12 1200 - - 90  11  97 %  12/05/12 1156 154/82 mmHg - - - -  12/05/12 1146 - - 83  - 98 %  12/05/12 1130 - - 80  16  97 %  12/05/12 1126 146/81 mmHg - - - -  12/05/12 1117 - - 79  - 98 %  12/05/12 1100 - - 81  15  97 %  12/05/12 1056 161/81 mmHg - - - -  12/05/12 1045 - - 81  17  100 %  12/05/12 1041 163/78 mmHg - - - -  12/05/12 1030 - - 82  17  98 %  12/05/12 1027 169/80 mmHg - - - -  12/05/12 1015 - - 86  15  97 %  12/05/12 1011 167/81 mmHg - - - -  12/05/12 1010 - 97.2 F (36.2 C) - - -     Recent laboratory studies:  Basename 12/04/12 1630  WBC 6.1  HGB 13.5  HCT 40.3  PLT 113*  NA 137  K 4.6  CL 99  CO2 30  BUN 16  CREATININE 1.25*  GLUCOSE 218*  INR --  CALCIUM 10.1     Discharge Medications:  Medication List     As of 12/06/2012  7:46 AM    STOP taking these medications         ciprofloxacin 500 MG tablet   Commonly known as: CIPRO      TAKE these medications         atorvastatin 10 MG tablet   Commonly known as: LIPITOR   Take 10 mg by mouth at bedtime.      CENTRUM SILVER ADULT 50+ PO   Take 1 tablet by mouth daily.      citalopram 20 MG tablet   Commonly known as: CELEXA   Take 60 mg by mouth at bedtime. Patient takes 60 mg by mouth daily at bedtime      docusate sodium 100 MG capsule   Commonly known as: COLACE   Take 1 capsule (100 mg total) by mouth 3 (three) times daily as needed for constipation.      esomeprazole 40 MG capsule   Commonly known as: NEXIUM   Take 40 mg by mouth 2 (two) times daily with a meal.      insulin glargine 100 UNIT/ML injection   Commonly known as: LANTUS   Inject 16 Units into the skin at bedtime.       insulin glulisine 100 UNIT/ML injection   Commonly known as: APIDRA   Inject 6-20 Units into the skin 3 (three) times daily before meals. >130: 10 units, >180 12 units, >260 14  Units. >300- 18 units      levothyroxine 100 MCG tablet   Commonly known as: SYNTHROID, LEVOTHROID   Take 100 mcg by mouth daily before breakfast.      LORazepam 1 MG tablet   Commonly known as: ATIVAN   Take 1 mg by mouth every 6 (six) hours as needed. For anxiety.      metFORMIN 1000 MG tablet   Commonly known as: GLUCOPHAGE   Take 1,000 mg by mouth 2 (two) times daily with a meal.      methocarbamol 500 MG tablet   Commonly known as: ROBAXIN   Take 1 tablet (500 mg total) by mouth 3 (three) times daily as needed (for muscle spasms).      ondansetron 4 MG tablet   Commonly known as: ZOFRAN   Take 1 tablet (4 mg total) by mouth every 8 (eight) hours as needed for nausea.      oxyCODONE-acetaminophen 10-325 MG per tablet   Commonly known as: PERCOCET   Take 1 tablet by mouth every 4 (four) hours as needed for pain.      polyethylene glycol powder powder   Commonly known as: GLYCOLAX/MIRALAX   Take 17 g by mouth daily.      VITAMIN B 12 PO   Take 500 mg by mouth daily.        Diagnostic Studies: Dg Cervical Spine 2 Or 3 Views  12/05/2012  *RADIOLOGY REPORT*  Clinical Data: Post ACDF  CERVICAL SPINE - 2-3 VIEW  Comparison: 12/04/2012  Findings: Supine cross-table lateral, swimmers, and AP views performed.  Post anterior fusion of C5-C6 with anterior plate, screws, and disc prosthesis noted. Mild degenerative disc disease changes C4-C5. Mild prevertebral soft tissue swelling compatible with surgery. No definite fracture or subluxation identified.  IMPRESSION: Post ACDF C5-C6.   Original Report Authenticated By: Ulyses Southward, M.D.    Dg Cervical Spine 2-3 Views  12/05/2012  *RADIOLOGY REPORT*  Clinical Data: Post ACDF C5-C6  CERVICAL SPINE - 2-3 VIEW  Comparison: 12/04/2012  Findings: Two digital C-arm  fluoroscopic images of the obtained intraoperatively are interpreted postoperatively. Anterior plate and screws with disc prosthesis at C5-C6. Bones appear demineralized. Endotracheal tube noted. Prevertebral soft tissues grossly normal thickness.  IMPRESSION: S/P anterior fusion C5-C6.   Original Report Authenticated By: Ulyses Southward, M.D.    Dg Cervical Spine 2 Or 3 Views  12/04/2012  *RADIOLOGY REPORT*  Clinical Data: Preop for surgical fusion.  CERVICAL SPINE - 2-3 VIEW  Comparison: None.  Findings: Severe degenerative disc disease is noted at C5-6 and C6- 7.  No fracture or significant spondylolisthesis is noted.  IMPRESSION: Severe degenerative disc disease at C5-6 and C6-7.   Original Report Authenticated By: Lupita Raider.,  M.D.           Follow-up Information    Follow up with Alvy Beal, MD. Call in 2 weeks. (As needed if symptoms worsen)    Contact information:   [address] [place] [address] [phone]          Discharge Plan:  discharge to home  Disposition:  Doing well Tolerating diet F/u 2 weeks   Signed: Venita Lick D for Dr. Venita Lick [facility] [phone] 12/06/2012, 7:46 AM

## 2012-12-06 NOTE — Evaluation (Signed)
Physical Therapy Evaluation Patient Details Name: Dawn Dean MRN: [id number] DOB: 09-30-1961 Today's Date: 12/06/2012 Time: 6213-0865 PT Time Calculation (min): 23 min  PT Assessment / Plan / Recommendation Clinical Impression  Patient is a 52 y.o. female s/p one level anterior cervical fusion.  She demonstrates mobility safe for d/c home today.  Feel spouse can assist up stairs for home entry and verbalized understanding of general spinal precautions and fall prevention techniques.  No further skilled PT needs at this time.  Recommend f/u outpatient PT once cleared for general conditioning and postural strengthening.    PT Assessment  Patent does not need any further PT services    Follow Up Recommendations  No PT follow up                Equipment Recommendations  None recommended by PT               Precautions / Restrictions Precautions Precautions: Fall;Cervical Required Braces or Orthoses: Cervical Brace Cervical Brace: Hard collar;Applied in sitting position   Pertinent Vitals/Pain 2/10 in neck      Mobility  Bed Mobility Bed Mobility: Sit to Supine Sit to Supine: 5: Supervision;HOB elevated Details for Bed Mobility Assistance: instructions given for sit>sidelying first, but patient performed in habitual manner with feet in then lying back; already had Aspen collar donned Transfers Transfers: Stand to Sit;Sit to Stand Sit to Stand: 5: Supervision;From bed Stand to Sit: To bed;5: Supervision Details for Transfer Assistance: slow with maintaining back precautions Ambulation/Gait Ambulation/Gait Assistance: 4: Min guard Ambulation Distance (Feet): 150 Feet Assistive device: None Ambulation/Gait Assistance Details: weaving to right down hallway, able to self correct, but did minguard for safety Gait Pattern: Step-through pattern         Exercises Other Exercises Other Exercises: Educated on general spinal precautions for sleeping, ADL's.  Discussed  exercises spouse stated had been educated in cane exercises.  Encouraged to wait at least 2 weeks and consult MD due to no over head activity,  Encouraged f/u PT when cleared by MD for conditioning and postural strengthening.      Visit Information  Last PT Received On: 12/06/12    Subjective Data  Subjective: I've been up, just got comfortable after she gave me some meds. Patient Stated Goal: To go home   Prior Functioning  Home Living Lives With: Spouse Type of Home: House Home Access: Stairs to enter Secretary/administrator of Steps: 5 Entrance Stairs-Rails: Right;Left Home Layout: One level Prior Function Level of Independence: Independent Vocation: Full time employment Comments: Worked as Therapist, 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 Communication Communication: No difficulties    Cognition  Overall Cognitive Status: Appears within functional limits for tasks assessed/performed Arousal/Alertness: Lethargic (sleepy with pain meds) Orientation Level: Appears intact for tasks assessed Behavior During Session: [facility] for tasks performed    Extremity/Trunk Assessment Right Lower Extremity Assessment RLE ROM/Strength/Tone: [facility] for tasks assessed Left Lower Extremity Assessment LLE ROM/Strength/Tone: [facility] for tasks assessed   Balance    End of Session PT - End of Session Activity Tolerance: Patient tolerated treatment well Patient left: in bed;with family/visitor present  GP     [facility] 12/06/2012, 9:15 AM  Sheran Lawless, PT [phone] 12/06/2012

## 2012-12-06 NOTE — Evaluation (Signed)
Occupational Therapy Evaluation Patient Details Name: Dawn Dean MRN: [id number] DOB: 1960/12/27 Today's Date: 12/06/2012 Time: 3086-5784 OT Time Calculation (min): 26 min  OT Assessment / Plan / Recommendation Clinical Impression  Pt demos decline in function with ADLs, strength, balancem safety and activity tolerance following surgery of cervical neck. Pt would benefir from skilled OT services to restore PLOF to return home safely    OT Assessment  Patient needs continued OT Services    Follow Up Recommendations  No OT follow up    Barriers to Discharge None    Equipment Recommendations  3 in 1 bedside comode;Tub/shower seat    Recommendations for Other Services    Frequency  Min 2X/week    Precautions / Restrictions Precautions Precautions: Cervical;Fall Precaution Comments: pt able to doff for hygiene and UB dressing, pt able to doff and donn cervical brace with sup Required Braces or Orthoses: Cervical Brace Cervical Brace: Hard collar;Other (comment) (can be off during hygiene and UB dressing) Restrictions Weight Bearing Restrictions: No       ADL  Grooming: Performed;Wash/dry hands;Wash/dry face;Brushing hair;Other (comment) (min A for hair care) Where Assessed - Grooming: Supported standing Upper Body Bathing: Simulated;Supervision/safety;Set up Where Assessed - Upper Body Bathing: Unsupported sitting Lower Body Bathing: Simulated;Minimal assistance Where Assessed - Lower Body Bathing: Unsupported sitting Upper Body Dressing: Performed;Minimal assistance Where Assessed - Upper Body Dressing: Unsupported sitting Lower Body Dressing: Performed;Minimal assistance Where Assessed - Lower Body Dressing: Unsupported sitting;Supported sit to stand Toilet Transfer: Supervision/safety;Min guard Toilet Transfer Method: Other (comment) (ambulating) Toilet Transfer Equipment: Regular height toilet Toileting - Clothing Manipulation and Hygiene: Performed;Min  guard Where Assessed - Engineer, mining and Hygiene: Standing Equipment Used: Sock aid;Gait belt;Long-handled sponge;Reacher    OT Diagnosis: Generalized weakness  OT Problem List: Decreased activity tolerance;Impaired balance (sitting and/or standing);Decreased knowledge of use of DME or AE;Pain;Decreased strength OT Treatment Interventions: Self-care/ADL training;Therapeutic activities;Therapeutic exercise;Neuromuscular education;DME and/or AE instruction;Balance training;Patient/family education   OT Goals Acute Rehab OT Goals OT Goal Formulation: With patient Time For Goal Achievement: 12/13/12 Potential to Achieve Goals: Good ADL Goals Pt Will Perform Grooming: with modified independence ADL Goal: Grooming - Progress: Goal set today Pt Will Perform Lower Body Bathing: with set-up;with supervision;with adaptive equipment ADL Goal: Lower Body Bathing - Progress: Goal set today Pt Will Perform Lower Body Dressing: with set-up;with supervision;with adaptive equipment ADL Goal: Lower Body Dressing - Progress: Goal set today Pt Will Transfer to Toilet: with DME;Grab bars;with modified independence ADL Goal: Toilet Transfer - Progress: Goal set today Pt Will Perform Toileting - Clothing Manipulation: with modified independence;Standing ADL Goal: Toileting - Clothing Manipulation - Progress: Goal set today Pt Will Perform Toileting - Hygiene: with modified independence ADL Goal: Toileting - Hygiene - Progress: Goal set today  Visit Information  Last OT Received On: 12/06/12    Subjective Data  Subjective: " My husband will be with me to help me at home where I will feel more comfortable " Patient Stated Goal: To return home   Prior Functioning     Home Living Lives With: Spouse Available Help at Discharge: Family Type of Home: House Home Access: Stairs to enter [employer] of Steps: 5 Entrance Stairs-Rails: Right;Left Home Layout: One  level Bathroom Shower/Tub: Tub/shower unit;Walk-in shower Bathroom Toilet: Standard Bathroom Accessibility: Yes Home Adaptive Equipment: None Additional Comments: Pt currently does not have any ADL A/E, pt provided with education and demo of A/E for home use Prior Function Level of Independence: Independent Driving: Yes Vocation:  Full time employment Comments: Worked as Therapist, 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 Communication Communication: No difficulties Dominant Hand: Right         Vision/Perception Vision - Assessment Eye Alignment: Within Functional Limits Perception Perception: Within Functional Limits   Cognition  Overall Cognitive Status: Appears within functional limits for tasks assessed/performed Arousal/Alertness: Lethargic Orientation Level: Appears intact for tasks assessed Behavior During Session: [facility] for tasks performed    Extremity/Trunk Assessment Right Upper Extremity Assessment RUE ROM/Strength/Tone: [facility] for tasks assessed (R shldr 90 degrees due to previous cervical nerve problems) Left Upper Extremity Assessment LUE ROM/Strength/Tone: Within functional levels Right Lower Extremity Assessment RLE ROM/Strength/Tone: [facility] for tasks assessed Left Lower Extremity Assessment LLE ROM/Strength/Tone: [facility] for tasks assessed     Mobility Bed Mobility Bed Mobility: Supine to Sit;Sit to Supine Supine to Sit: 6: Modified independent (Device/Increase time) Sit to Supine: 6: Modified independent (Device/Increase time) Details for Bed Mobility Assistance: instructions given for sit>sidelying first, but patient performed in habitual manner with feet in then lying back; already had Aspen collar donned Transfers Transfers: Sit to Stand;Stand to Sit Sit to Stand: 5: Supervision Stand to Sit: 5: Supervision Details for Transfer Assistance: slow with maintaining back precautions     Shoulder Instructions     Exercise Other Exercises Other Exercises:  Educated on general spinal precautions for sleeping, ADL's.  Discussed exercises spouse stated had been educated in cane exercises.  Encouraged to wait at least 2 weeks and consult MD due to no over head activity,  Encouraged f/u PT when cleared by MD for conditioning and postural strengthening.   Balance Balance Balance Assessed: No   End of Session OT - End of Session Equipment Utilized During Treatment: Gait belt Activity Tolerance: Patient limited by fatigue Patient left: in bed;with call bell/phone within reach  GO Functional Assessment Tool Used: clinical judgement Functional Limitation: Self care Self Care Current Status (W0981): At least 20 percent but less than 40 percent impaired, limited or restricted Self Care Goal Status (X9147): At least 1 percent but less than 20 percent impaired, limited or restricted   Galen Manila 12/06/2012, 9:58 AM

## 2012-12-06 NOTE — Plan of Care (Signed)
Problem: Phase II Progression Outcomes Goal: Discharge plan established Pt doing well and will be discharged home with family after acute care stay. No follow up OT recommended at this time

## 2013-04-22 ENCOUNTER — Ambulatory Visit: Payer: Self-pay | Admitting: Internal Medicine

## 2013-08-28 ENCOUNTER — Observation Stay: Payer: Self-pay | Admitting: Internal Medicine

## 2013-08-28 LAB — URINALYSIS, COMPLETE
Bilirubin,UR: NEGATIVE
Glucose,UR: >=500 mg/dL (ref 0–75)
Ketone: NEGATIVE
Leukocyte Esterase: NEGATIVE
Protein: NEGATIVE
RBC,UR: <1 /HPF (ref 0–5)
Specific Gravity: 1.025 (ref 1.003–1.030)
Squamous Epithelial: NONE SEEN
WBC UR: <1 /HPF (ref 0–5)

## 2013-08-28 LAB — COMPREHENSIVE METABOLIC PANEL
Alkaline Phosphatase: 222 U/L — ABNORMAL HIGH (ref 50–136)
Calcium, Total: 9.4 mg/dL (ref 8.5–10.1)
Chloride: 102 mmol/L (ref 98–107)
Co2: 28 mmol/L (ref 21–32)
EGFR (African American): >60
Glucose: 193 mg/dL — ABNORMAL HIGH (ref 65–99)
Osmolality: 274 (ref 275–301)
Sodium: 134 mmol/L — ABNORMAL LOW (ref 136–145)
Total Protein: 8.2 g/dL (ref 6.4–8.2)

## 2013-08-28 LAB — CBC
HCT: 43.8 % (ref 35.0–47.0)
HGB: 14.8 g/dL (ref 12.0–16.0)
MCH: 30.1 pg (ref 26.0–34.0)
MCV: 89 fL (ref 80–100)
Platelet: 92 10*3/uL — ABNORMAL LOW (ref 150–440)
RDW: 13.8 % (ref 11.5–14.5)

## 2013-08-28 LAB — CK TOTAL AND CKMB (NOT AT ARMC)
CK-MB: 1 ng/mL (ref 0.5–3.6)
CK-MB: 1.1 ng/mL (ref 0.5–3.6)

## 2013-08-28 LAB — DRUG SCREEN, URINE
Cannabinoid 50 Ng, Ur ~~LOC~~: NEGATIVE (ref ?–50)
Methadone, Ur Screen: NEGATIVE (ref ?–300)
Opiate, Ur Screen: POSITIVE (ref ?–300)
Tricyclic, Ur Screen: NEGATIVE (ref ?–1000)

## 2013-08-28 LAB — TSH: Thyroid Stimulating Horm: 1.51 u[IU]/mL

## 2013-08-29 LAB — BASIC METABOLIC PANEL
Anion Gap: 6 — ABNORMAL LOW (ref 7–16)
BUN: 11 mg/dL (ref 7–18)
Calcium, Total: 8.3 mg/dL — ABNORMAL LOW (ref 8.5–10.1)
Chloride: 107 mmol/L (ref 98–107)
Co2: 26 mmol/L (ref 21–32)
Creatinine: 0.84 mg/dL (ref 0.60–1.30)
EGFR (Non-African Amer.): >60
Osmolality: 278 (ref 275–301)

## 2013-08-29 LAB — CBC WITH DIFFERENTIAL/PLATELET
Basophil #: 0.1 10*3/uL (ref 0.0–0.1)
Eosinophil #: 0.3 10*3/uL (ref 0.0–0.7)
Lymphocyte #: 1.4 10*3/uL (ref 1.0–3.6)
Lymphocyte %: 30.9 %
MCH: 30.2 pg (ref 26.0–34.0)
MCHC: 33.6 g/dL (ref 32.0–36.0)
MCV: 90 fL (ref 80–100)
Monocyte #: 0.3 x10 3/mm (ref 0.2–0.9)
Monocyte %: 7.3 %
Neutrophil #: 2.4 10*3/uL (ref 1.4–6.5)
Neutrophil %: 52.6 %
Platelet: 76 10*3/uL — ABNORMAL LOW (ref 150–440)
RBC: 4.28 10*6/uL (ref 3.80–5.20)
RDW: 14 % (ref 11.5–14.5)

## 2013-08-29 LAB — CK TOTAL AND CKMB (NOT AT ARMC): CK-MB: 1.5 ng/mL (ref 0.5–3.6)

## 2013-12-29 ENCOUNTER — Ambulatory Visit: Payer: Self-pay | Admitting: Internal Medicine

## 2014-02-12 ENCOUNTER — Ambulatory Visit: Payer: Self-pay | Admitting: Internal Medicine

## 2014-04-25 ENCOUNTER — Emergency Department: Payer: Self-pay | Admitting: Internal Medicine

## 2014-04-25 LAB — CBC
HCT: 42.4 % (ref 35.0–47.0)
HGB: 13.4 g/dL (ref 12.0–16.0)
MCH: 28.8 pg (ref 26.0–34.0)
MCHC: 31.6 g/dL — AB (ref 32.0–36.0)
MCV: 91 fL (ref 80–100)
PLATELETS: 80 10*3/uL — AB (ref 150–440)
RBC: 4.64 10*6/uL (ref 3.80–5.20)
RDW: 14.7 % — ABNORMAL HIGH (ref 11.5–14.5)
WBC: 3.6 10*3/uL (ref 3.6–11.0)

## 2014-04-25 LAB — COMPREHENSIVE METABOLIC PANEL
ALBUMIN: 4 g/dL (ref 3.4–5.0)
ALT: 36 U/L (ref 12–78)
ANION GAP: 5 — AB (ref 7–16)
AST: 37 U/L (ref 15–37)
Alkaline Phosphatase: 170 U/L — ABNORMAL HIGH
BILIRUBIN TOTAL: 0.3 mg/dL (ref 0.2–1.0)
BUN: 19 mg/dL — ABNORMAL HIGH (ref 7–18)
Calcium, Total: 9.6 mg/dL (ref 8.5–10.1)
Chloride: 102 mmol/L (ref 98–107)
Co2: 32 mmol/L (ref 21–32)
Creatinine: 1.04 mg/dL (ref 0.60–1.30)
Glucose: 184 mg/dL — ABNORMAL HIGH (ref 65–99)
Osmolality: 285 (ref 275–301)
Potassium: 4 mmol/L (ref 3.5–5.1)
SODIUM: 139 mmol/L (ref 136–145)
TOTAL PROTEIN: 7.7 g/dL (ref 6.4–8.2)

## 2014-04-25 LAB — LIPASE, BLOOD: Lipase: 433 U/L — ABNORMAL HIGH (ref 73–393)

## 2014-05-01 ENCOUNTER — Ambulatory Visit: Payer: Self-pay | Admitting: Internal Medicine

## 2014-05-19 LAB — LIPID PANEL
Cholesterol: 175 mg/dL (ref 0–200)
HDL: 33 mg/dL — AB (ref 35–70)
LDL CALC: 89 mg/dL
LDL/HDL RATIO: 5.4
Triglycerides: 269 mg/dL — AB (ref 40–160)

## 2014-05-19 LAB — HEMOGLOBIN A1C: Hgb A1c MFr Bld: 8.5 % — AB (ref 4.0–6.0)

## 2014-05-19 LAB — TSH: TSH: 0.9 u[IU]/mL (ref 0.41–5.90)

## 2014-08-19 ENCOUNTER — Emergency Department: Payer: Self-pay | Admitting: Emergency Medicine

## 2014-08-19 LAB — COMPREHENSIVE METABOLIC PANEL
ALK PHOS: 184 U/L — AB
ALT: 35 U/L
Albumin: 4 g/dL (ref 3.4–5.0)
Anion Gap: 8 (ref 7–16)
BUN: 20 mg/dL — AB (ref 7–18)
Bilirubin,Total: 0.5 mg/dL (ref 0.2–1.0)
CHLORIDE: 104 mmol/L (ref 98–107)
CO2: 25 mmol/L (ref 21–32)
CREATININE: 0.91 mg/dL (ref 0.60–1.30)
Calcium, Total: 9.3 mg/dL (ref 8.5–10.1)
EGFR (African American): >60
EGFR (Non-African Amer.): >60
Glucose: 188 mg/dL — ABNORMAL HIGH (ref 65–99)
OSMOLALITY: 281 (ref 275–301)
Potassium: 3.8 mmol/L (ref 3.5–5.1)
SGOT(AST): 37 U/L (ref 15–37)
SODIUM: 137 mmol/L (ref 136–145)
Total Protein: 8.1 g/dL (ref 6.4–8.2)

## 2014-08-19 LAB — URINALYSIS, COMPLETE
BACTERIA: NONE SEEN
BILIRUBIN, UR: NEGATIVE
Blood: NEGATIVE
Glucose,UR: >=500 mg/dL (ref 0–75)
KETONE: NEGATIVE
LEUKOCYTE ESTERASE: NEGATIVE
Nitrite: NEGATIVE
Ph: 5 (ref 4.5–8.0)
Protein: NEGATIVE
RBC,UR: 1 /HPF (ref 0–5)
Specific Gravity: 1.024 (ref 1.003–1.030)
Squamous Epithelial: 3
WBC UR: 3 /HPF (ref 0–5)

## 2014-08-19 LAB — CBC
HCT: 46 % (ref 35.0–47.0)
HGB: 15.1 g/dL (ref 12.0–16.0)
MCH: 29.8 pg (ref 26.0–34.0)
MCHC: 32.9 g/dL (ref 32.0–36.0)
MCV: 91 fL (ref 80–100)
Platelet: 107 10*3/uL — ABNORMAL LOW (ref 150–440)
RBC: 5.08 10*6/uL (ref 3.80–5.20)
RDW: 14.4 % (ref 11.5–14.5)
WBC: 6.8 10*3/uL (ref 3.6–11.0)

## 2014-08-19 LAB — DRUG SCREEN, URINE
Amphetamines, Ur Screen: NEGATIVE (ref ?–1000)
Barbiturates, Ur Screen: NEGATIVE (ref ?–200)
Benzodiazepine, Ur Scrn: POSITIVE (ref ?–200)
Cannabinoid 50 Ng, Ur ~~LOC~~: NEGATIVE (ref ?–50)
Cocaine Metabolite,Ur ~~LOC~~: NEGATIVE (ref ?–300)
MDMA (ECSTASY) UR SCREEN: NEGATIVE (ref ?–500)
Methadone, Ur Screen: NEGATIVE (ref ?–300)
Opiate, Ur Screen: POSITIVE (ref ?–300)
PHENCYCLIDINE (PCP) UR S: NEGATIVE (ref ?–25)
TRICYCLIC, UR SCREEN: NEGATIVE (ref ?–1000)

## 2014-08-19 LAB — ETHANOL: Ethanol: <3 mg/dL

## 2014-08-19 LAB — SALICYLATE LEVEL

## 2014-08-19 LAB — ACETAMINOPHEN LEVEL: Acetaminophen: <2 ug/mL

## 2014-09-28 LAB — HEMOGLOBIN A1C: HEMOGLOBIN A1C: 7.5 % — AB (ref 4.0–6.0)

## 2014-09-28 LAB — BASIC METABOLIC PANEL: Glucose: 140 mg/dL

## 2014-09-28 LAB — TSH: TSH: 5.83 u[IU]/mL (ref <=5.90)

## 2014-09-28 LAB — LIPID PANEL
CHOLESTEROL: 297 mg/dL — AB (ref 0–200)
HDL: 35 mg/dL (ref 35–70)

## 2014-10-12 ENCOUNTER — Ambulatory Visit: Payer: Self-pay | Admitting: Nurse Practitioner

## 2014-10-19 ENCOUNTER — Encounter: Payer: Self-pay | Admitting: Nurse Practitioner

## 2014-10-19 ENCOUNTER — Encounter (INDEPENDENT_AMBULATORY_CARE_PROVIDER_SITE_OTHER): Payer: Self-pay

## 2014-10-19 ENCOUNTER — Ambulatory Visit (INDEPENDENT_AMBULATORY_CARE_PROVIDER_SITE_OTHER): Payer: BC Managed Care – PPO | Admitting: Nurse Practitioner

## 2014-10-19 VITALS — BP 130/74 | HR 71 | Temp 98.4°F | Resp 14 | Ht 65.0 in | Wt 208.8 lb

## 2014-10-19 DIAGNOSIS — E119 Type 2 diabetes mellitus without complications: Secondary | ICD-10-CM

## 2014-10-19 DIAGNOSIS — R300 Dysuria: Secondary | ICD-10-CM

## 2014-10-19 DIAGNOSIS — F1911 Other psychoactive substance abuse, in remission: Secondary | ICD-10-CM

## 2014-10-19 DIAGNOSIS — Z87898 Personal history of other specified conditions: Secondary | ICD-10-CM

## 2014-10-19 DIAGNOSIS — E669 Obesity, unspecified: Secondary | ICD-10-CM

## 2014-10-19 DIAGNOSIS — Z23 Encounter for immunization: Secondary | ICD-10-CM

## 2014-10-19 LAB — POCT URINALYSIS DIPSTICK
BILIRUBIN UA: NEGATIVE
Ketones, UA: NEGATIVE
Leukocytes, UA: NEGATIVE
NITRITE UA: NEGATIVE
PH UA: 5.5
Protein, UA: NEGATIVE
RBC UA: NEGATIVE
SPEC GRAV UA: 1.015
Urobilinogen, UA: 0.2

## 2014-10-19 MED ORDER — GABAPENTIN 400 MG PO CAPS: 400.0000 mg | ORAL_CAPSULE | Freq: Three times a day (TID) | ORAL | Status: DC

## 2014-10-19 NOTE — Progress Notes (Signed)
Subjective:    Patient ID: Dawn Dean, female    DOB: 1961/11/06, 53 y.o.   MRN: [id number]  HPI  1) Former Substance Abuse-  3 months off of ativan and percocet. [facility] spring next year.   2) Dysuria, Burning sensation. Onset 1 wk. Drinking diet Coca Cola and not enough water. On Invokana. Discussed importance of drinking water.   3)  Losing weight, off of bydureon, and lantus  Invokana 100. BS 150's at home  Diet- Eating small meals 3 x day, drinking min. Amount of water Exercise- Walking 7 days a week x 20-30 min.  BMI- 34.7. Diet and exercise. 170 lbs first goal weight.   4) Health Maintenance- Immunizations- Flu shot today Pap- Last year by previous doctor Mammogram- Due soon Eye exam- Due soon Dental exam- Up to date   BP 130/74 mmHg  Pulse 71  Temp(Src) 98.4 F (36.9 C) (Oral)  Resp 14  Ht 5\' 5"  (1.651 m)  Wt 208 lb 12 oz (94.688 kg)  BMI 34.74 kg/m2  SpO2 98%   Review of Systems  Constitutional: Negative for fever, chills, diaphoresis, appetite change, fatigue and unexpected weight change.  HENT: Negative for congestion, hearing loss and sore throat.   Eyes: Negative for discharge and redness.  Respiratory: Negative for chest tightness, shortness of breath and wheezing.   Cardiovascular: Negative for chest pain, palpitations and leg swelling.  Gastrointestinal: Negative for nausea, vomiting, abdominal pain, diarrhea and constipation.  Endocrine: Negative for polydipsia, polyphagia and polyuria.  Musculoskeletal: Positive for arthralgias. Negative for myalgias.       Knee pain.   Neurological: Negative for headaches.  Psychiatric/Behavioral:       Denies anxiety/depression   Past Medical History  Diagnosis Date  . Hypothyroidism   . Anxiety   . Mental disorder   . Diabetes mellitus without complication     diagnosed -20 yrs. ago   . GERD (gastroesophageal reflux disease)   . Headache(784.0)     History    Social History  . Marital Status: Married    Spouse Name: [name]    Number of Children: [name]  . Years of Education: [name]   Occupational History  . Not on file.   Social History Main Topics  . Smoking status: Former Smoker    Quit date: 12/05/1993  . Smokeless tobacco: Never Used  . Alcohol Use: No     Comment: very seldom   . Drug Use: No  . Sexual Activity:    Partners: Male   Other Topics Concern  . Not on file   Social History Narrative   Married 30 years to husband   [place]   1 son 55 yo   2 dogs- taz and oreo   1 cat- pumpkin   Garment/textile technologistAssociate RN    Currently not working, will start looking for work early 2016    - On the Conservation officer, [employer]        Past Surgical History  Procedure Laterality Date  . Joint replacement      L knee  . Knee arthroscopy      L knee  . Sinus surgery      for deviated septum  . Cesarean section      1985  . Dilation and curettage of uterus    . Cervical discectomy  12/05/2012  . Anterior cervical decomp/discectomy fusion  12/05/2012    Procedure: ANTERIOR CERVICAL DECOMPRESSION/DISCECTOMY FUSION  1 LEVEL;  Surgeon: Venita Lickahari Brooks, MD;  [facility];  Service: Orthopedics;  Laterality: [name];  ACDF C5-6    History reviewed. No pertinent family history.  No Known Allergies  Current Outpatient Prescriptions on File Prior to Visit  Medication Sig Dispense Refill  . atorvastatin (LIPITOR) 10 MG tablet Take 10 mg by mouth at bedtime.     . citalopram (CELEXA) 20 MG tablet Take 40 mg by mouth at bedtime. Patient takes 40 mg by mouth daily at bedtime    . docusate sodium (COLACE) 100 MG capsule Take 1 capsule (100 mg total) by mouth 3 (three) times daily as needed for constipation. 30 capsule 0  . esomeprazole (NEXIUM) 40 MG capsule Take 40 mg by mouth 2 (two) times daily with a meal.     . metFORMIN (GLUCOPHAGE) 1000 MG tablet Take 1,000 mg by mouth 2 (two) times daily with a meal.    .  methocarbamol (ROBAXIN) 500 MG tablet Take 1 tablet (500 mg total) by mouth 3 (three) times daily as needed (for muscle spasms). (Patient taking differently: Take 1,000 mg by mouth 4 (four) times daily. ) 60 tablet 0  . Multiple Vitamins-Minerals (CENTRUM SILVER ADULT 50+ PO) Take 1 tablet by mouth daily.    . ondansetron (ZOFRAN) 4 MG tablet Take 1 tablet (4 mg total) by mouth every 8 (eight) hours as needed for nausea. 20 tablet 0   No current facility-administered medications on file prior to visit.    BP 130/74 mmHg  Pulse 71  Temp(Src) 98.4 F (36.9 C) (Oral)  Resp 14  Ht 5\' 5"  (1.651 m)  Wt 208 lb 12 oz (94.688 kg)  BMI 34.74 kg/m2  SpO2 98%        Objective:   Physical Exam  Constitutional: She is oriented to person, place, and time. She appears well-developed and well-nourished. No distress.  HENT:  Head: Normocephalic and atraumatic.  Right Ear: External ear normal.  Left Ear: External ear normal.  Mouth/Throat: Oropharynx is clear and moist. No oropharyngeal exudate.  Eyes: Conjunctivae and EOM are normal. Pupils are equal, round, and reactive to light. Right eye exhibits no discharge. Left eye exhibits no discharge. No scleral icterus.  Neck: Normal range of motion. Neck supple. No tracheal deviation present. No thyromegaly present.  Cardiovascular: Normal rate, regular rhythm and intact distal pulses.   Pulmonary/Chest: Effort normal and breath sounds normal. No respiratory distress. She has no wheezes. She has no rales. She exhibits no tenderness.  Abdominal: Soft. Bowel sounds are normal. She exhibits no distension and no mass. There is no tenderness. There is no rebound and no guarding.  Musculoskeletal: Normal range of motion. She exhibits no edema or tenderness.  Lymphadenopathy:    She has no cervical adenopathy.  Neurological: She is alert and oriented to person, place, and time.  TKA Left knee. Right Knee DTR's normal  Skin: Skin is warm and dry. No rash  noted. She is not diaphoretic. No erythema. No pallor.  Psychiatric: She has a normal mood and affect. Her behavior is normal. Judgment and thought content normal.      Assessment & Plan:

## 2014-10-19 NOTE — Patient Instructions (Signed)
Endocrinology referral is in. We will contact you with further information shortly. Follow up as needed.  Drink lots of water.

## 2014-10-19 NOTE — Progress Notes (Signed)
Pre visit review using our clinic review tool, if applicable. No additional management support is needed unless otherwise documented below in the visit note. 

## 2014-10-20 DIAGNOSIS — Z23 Encounter for immunization: Secondary | ICD-10-CM | POA: Insufficient documentation

## 2014-10-20 DIAGNOSIS — R300 Dysuria: Secondary | ICD-10-CM | POA: Insufficient documentation

## 2014-10-20 DIAGNOSIS — F1911 Other psychoactive substance abuse, in remission: Secondary | ICD-10-CM | POA: Insufficient documentation

## 2014-10-20 NOTE — Assessment & Plan Note (Signed)
Stable. Pt is eating smaller portions and better food. She is currently working toward a goal of 170 lbs through diet and exercise. Help is given by [facility].

## 2014-10-20 NOTE — Assessment & Plan Note (Signed)
Influenza vaccination today. Resolved.

## 2014-10-20 NOTE — Assessment & Plan Note (Signed)
Unresolved. On Invokana. Urine negative for infection. Discussed increasing water intake and decreasing Diet coke intake.

## 2014-11-10 ENCOUNTER — Encounter: Payer: Self-pay | Admitting: Endocrinology

## 2014-11-10 ENCOUNTER — Ambulatory Visit (INDEPENDENT_AMBULATORY_CARE_PROVIDER_SITE_OTHER): Payer: BC Managed Care – PPO | Admitting: Endocrinology

## 2014-11-10 VITALS — BP 128/76 | HR 69 | Ht 65.0 in | Wt 213.5 lb

## 2014-11-10 DIAGNOSIS — E039 Hypothyroidism, unspecified: Secondary | ICD-10-CM

## 2014-11-10 DIAGNOSIS — E119 Type 2 diabetes mellitus without complications: Secondary | ICD-10-CM

## 2014-11-10 DIAGNOSIS — E785 Hyperlipidemia, unspecified: Secondary | ICD-10-CM

## 2014-11-10 DIAGNOSIS — E669 Obesity, unspecified: Secondary | ICD-10-CM

## 2014-11-10 LAB — HM DIABETES FOOT EXAM: HM Diabetic Foot Exam: NORMAL

## 2014-11-10 MED ORDER — GLUCOSE BLOOD VI STRP: ORAL_STRIP | Status: DC

## 2014-11-10 MED ORDER — ONETOUCH DELICA LANCETS 33G MISC: Status: DC

## 2014-11-10 NOTE — Patient Instructions (Addendum)
Check sugars 2 x daily ( before breakfast and before supper).  Record them in a log book and bring that/meter to next appointment.   The current medical regimen is effective;  continue present plan and medications.   [facility]ul [facility].   Please come back for a follow-up appointment in 2 months.

## 2014-11-10 NOTE — Progress Notes (Signed)
Reason for visit-  Dawn Dean is a 53 y.o.-year-old female, referred by her PCP,  Doss, Oleh Geninarrie M, NP for management of Type 2 diabetes, uncontrolled, without complications. Previously seen endocrine, Dr Renae FicklePaul, at [facility] 3-4 months ago- dismissed from [facility] due to substance abuse issues.    HPI- Patient has been diagnosed with diabetes in ~1995. Recalls being initially on lifestyle modifications.  Tried  Metformin, Invokana, Bydureon, Lantus , Glipizide/Glimeperide/Glyburide, Actos/Avandia, Bydureon/Tanzeum in the past.   Didn't tolerate Tanzeum due to Gi side effects ( nausea, gastritis). *taken off Lantus and Bydureon by [facility] due to lows about 2 months ago *60 days clean, now dealing with stress, anxiety and replaces emotions with food  Pt is currently on a regimen of: - Metformin 1000 mg po bid - Invokana 100 mg daily ( reduced from 300 mg daily 2 months ago [facility])    Last hemoglobin A1c was: by report 7.1% 1 month ago- I don't have these and the patient will bring these for review.  Lab Results  Component Value Date   HGBA1C 9.4* 12/05/2012     Pt checks her sugars 2x a day .  Hasn't checked it in the last 2 weeks. Uses  Mail order , ? Brand glucometer. Meter not working correctly. By recall they are:  PREMEAL Breakfast Lunch Dinner Bedtime Overall  Glucose range: <140   <100   Mean/median:        POST-MEAL PC Breakfast PC Lunch PC Dinner  Glucose range:     Mean/median:       Hypoglycemia-  Some  Lows around 58 few months ago, following which her meds were decreased. she has hypoglycemia awareness at 5170. She hasn't had the hypoglycemia since then.   Dietary habits- usually eats three times daily- not eating BF these days, stress eating a lot  . Tries to limit carbs, sweetened beverages, sodas, desserts. Drinks diet sodas, unsweet tea, occasional chocolate Exercise- walks 20 min daily of TD o/w walks her dogs for 30 minutes Weight -  downtrending, but gained some of the weight back due to recent overeating Wt Readings from Last 3 Encounters:  11/10/14 213 lb 8 oz (96.843 kg)  10/19/14 208 lb 12 oz (94.688 kg)  12/05/12 212 lb (96.163 kg)    Diabetes Complications-  Nephropathy- No  CKD, last BUN/creatinine- GFR 48- but per patient no CKD on recent labs, which are awaited Lab Results  Component Value Date   BUN 16 12/04/2012   CREATININE 1.25* 12/04/2012   No results found for: GFR    Retinopathy- No, Last DEE was this year, has appt for next year this coming feb Neuropathy- no numbness and tingling in her feet. No known neuropathy. Taking neurontin for her "mood" per patient. Associated history - No CAD . No prior stroke.   Hypothyroidism- Has hypothyroidism that was diagnosed about 2005. Last dose change of LT4 ~1 year ago. Currently taking levothyroxine, dose of 100 mcg daily but not recorded on med list. Taking it separate from food, but with her pills and MVI.   her last TSH was No results found for: TSH Review of systems: [ x  ] complains of    [  ] denies [ x ] weight gain-diet related  [  ] constipation  [  ] fatigue  [  ] dry skin  [  ] cold intolerance [  ] hair changes [  ] noticing any enlargement in size of thyroid [  ]  lumps in neck [  ] dysphagia   Hyperlipidemia-  her last set of lipids were- Currently on Lipitor 10mg  daily. Tolerating well.  No results found for: CHOL, HDL, LDLCALC, LDLDIRECT, TRIG, CHOLHDL  Blood Pressure/HTN- Patient's blood pressure is well controlled today on current regimen that includes ACE-I ( Lisinopril).  Pt has FH of DM in 1st cousin who has T1DM.  I have reviewed the patient's past medical history, family and social history, surgical history, medications and allergies.  Past Medical History  Diagnosis Date  . Hypothyroidism   . Anxiety   . Mental disorder   . Diabetes mellitus without complication     diagnosed -20 yrs. ago   . GERD (gastroesophageal reflux  disease)   . ZOXWRUEA(540.9)    Past Surgical History  Procedure Laterality Date  . Joint replacement      L knee  . Knee arthroscopy      L knee  . Sinus surgery      for deviated septum  . Cesarean section      1985  . Dilation and curettage of uterus    . Cervical discectomy  12/05/2012  . Anterior cervical decomp/discectomy fusion  12/05/2012    Procedure: ANTERIOR CERVICAL DECOMPRESSION/DISCECTOMY FUSION 1 LEVEL;  Surgeon: Venita Lick, MD;  Location: [facility] OR;  Service: Orthopedics;  Laterality: [name];  ACDF C5-6   Family History  Problem Relation Age of Onset  . Diabetes Cousin   . Heart disease Other    History   Social History  . Marital Status: Married    Spouse Name: [name]    Number of Children: [name]  . Years of Education: [name]   Occupational History  . Not on file.   Social History Main Topics  . Smoking status: Former Smoker    Quit date: 12/05/1993  . Smokeless tobacco: Never Used  . Alcohol Use: No     Comment: very seldom   . Drug Use: No  . Sexual Activity:    Partners: Male   Other Topics Concern  . 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 atorvastatin (LIPITOR) 10 MG tablet Take 10 mg by mouth at bedtime.     . canagliflozin (INVOKANA) 100 MG TABS tablet Take 100 mg by mouth daily.    . citalopram (CELEXA) 20 MG tablet Take 40 mg by mouth at bedtime. Patient takes 40 mg by mouth daily at bedtime    . docusate sodium (COLACE) 100 MG capsule Take 1 capsule (100 mg total) by mouth 3 (three) times daily as needed for constipation. 30 capsule 0  . esomeprazole (NEXIUM) 40 MG capsule Take 40 mg by mouth 2  (two) times daily with a meal.     . gabapentin (NEURONTIN) 400 MG capsule Take 1 capsule (400 mg total) by mouth 3 (three) times daily. 90 capsule 0  . ibuprofen (ADVIL,MOTRIN) 200 MG tablet Take 600 mg by mouth every 6 (six) hours as needed.    Marland Kitchen lisinopril (PRINIVIL,ZESTRIL) 10 MG tablet Take 10 mg  by mouth daily.    . metFORMIN (GLUCOPHAGE) 1000 MG tablet Take 1,000 mg by mouth 2 (two) times daily with a meal.    . methocarbamol (ROBAXIN) 500 MG tablet Take 1 tablet (500 mg total) by mouth 3 (three) times daily as needed (for muscle spasms). (Patient taking differently: Take 1,000 mg by mouth 4 (four) times daily. ) 60 tablet 0  . Multiple Vitamins-Minerals (CENTRUM SILVER ADULT 50+ PO) Take 1 tablet by mouth daily.    . Omega-3 Fatty Acids (FISH OIL) 300 MG CAPS Take 3 capsules by mouth daily.    . ondansetron (ZOFRAN) 4 MG tablet Take 1 tablet (4 mg total) by mouth every 8 (eight) hours as needed for nausea. 20 tablet 0  . QUEtiapine (SEROQUEL) 100 MG tablet Take 100 mg by mouth at bedtime.    . traZODone (DESYREL) 100 MG tablet Take 100 mg by mouth at bedtime.     No current facility-administered medications on file prior to visit.   No Known Allergies   Review of Systems: [x]  complains of  [  ] denies General:   [ x ] Recent weight change [  ] Fatigue  [  ] Loss of appetite Eyes: [  ]  Vision Difficulty [  ]  Eye pain ENT: [  ]  Hearing difficulty [  ]  Difficulty Swallowing CVS: [  ] Chest pain [  ]  Palpitations/Irregular Heart beat [  ]  Shortness of breath lying flat [  ] Swelling of legs Resp: [  ] Frequent Cough [  ] Shortness of Breath  [  ]  Wheezing GI: [  ] Heartburn  [  ] Nausea or Vomiting  [  ] Diarrhea [  ] Constipation  [  ] Abdominal Pain GU: [  ]  Polyuria  [  ]  nocturia Bones/joints:  [ x ]  Muscle aches  [ x ] Joint Pain  [  ] Bone pain Skin/Hair/Nails: [  ]  Rash  [  ] New stretch marks [  ]  Itching [  ] Hair loss [  ]  Excessive hair growth Reproduction: [ x  ] Low sexual desire , [  ]  Women: Menstrual cycle problems [  ]  Women: Breast Discharge [  ] Men: Difficulty with erections [  ]  Men: Enlarged Breasts CNS: [  ] Frequent Headaches [  ] Blurry vision [  ] Tremors [  ] Seizures [  ] Loss of consciousness [  ] Localized weakness Endocrine: [  ]  Excess thirst [  ]  Feeling excessively hot [  ]  Feeling excessively cold Heme: [  ]  Easy bruising [  ]  Enlarged glands or lumps in neck Allergy: [  ]  Food allergies [  ] Environmental allergies  PE: BP 128/76 mmHg  Pulse 69  Ht 5\' 5"  (1.651 m)  Wt 213 lb 8 oz (96.843 kg)  BMI 35.53 kg/m2  SpO2 96% Wt Readings from Last 3 Encounters:  11/10/14 213 lb 8 oz (96.843 kg)  10/19/14 208 lb 12 oz (94.688 kg)  12/05/12 212 lb (96.163 kg)   GENERAL: No acute distress, well developed HEENT:  Eye exam shows normal external appearance. Oral exam shows normal mucosa .  NECK:   Neck exam shows no lymphadenopathy. No Carotids bruits. Thyroid is not enlarged and no nodules felt.  no acanthosis nigricans, anterior neck surgery scar well healed LUNGS:  Chest is symmetrical. Lungs are clear to auscultation.Marland Kitchen.   HEART:         Heart sounds:  S1 and S2 are normal. No murmurs or clicks heard. ABDOMEN:  No Distention present. Liver and spleen are not palpable. No other mass or tenderness present.  EXTREMITIES:     There is no edema. 2+ DP pulses  NEUROLOGICAL:     Grossly intact.            Diabetic foot exam done with shoes and socks removed: Normal Monofilament testing bilaterally. No deformities of toes.  Nails  Not dystrophic. Skin normal color. No open wounds. Dry skin. Bilateral bunions great toes MUSCULOSKELETAL:       There is no enlargement or gross deformity of the joints.  SKIN:       No rash  ASSESSMENT AND PLAN: Problem List Items Addressed This Visit      Endocrine   Diabetes mellitus type II, controlled - Primary    By report, her sugars are at target. I have asked her to check her sugars  2 x daily at various time points. New meter( one touch mini) given to patient along with rx for testing supplies.   Discussed importance of diet and exercise. She will get back to mindful eating and avoid stress eating.  Will obtain recent labs from Dr Renae FicklePaul. The patient is also going to drop off the recent labs from detox center for review.   She can continue current medications for now, assuming that her GFR is in acceptable range still. Last GFR 2014 was low.   Discussed foot care, eye exams and hypoglycemia.   RTC 2 months    Hypothyroidism    Appears clinically euthyroid today. She will get her most recent labs from detox center. Will obtain notes from Dr Renae FicklePaul.  She will continue current dose of levothyroxine for now.  Will confirm with her pharmacy about her levothyroxine dose, and compliance wrt refills.       Other   Obesity (BMI 30-39.9)    She will resume working on her diet and exercise.  She has been losing weight with these efforts, while she is on SGLT2 inhibitor as well.     Hyperlipidemia    Currently on statin. Obtain last lipid testing.         - Return to clinic in 2 mo with sugar log/meter.  Rhea Kaelin PUSHKAR 11/11/2014 1:25 PM

## 2014-11-11 ENCOUNTER — Encounter: Payer: Self-pay | Admitting: Endocrinology

## 2014-11-11 DIAGNOSIS — E785 Hyperlipidemia, unspecified: Secondary | ICD-10-CM | POA: Insufficient documentation

## 2014-11-11 DIAGNOSIS — E039 Hypothyroidism, unspecified: Secondary | ICD-10-CM | POA: Insufficient documentation

## 2014-11-11 NOTE — Assessment & Plan Note (Signed)
She will resume working on her diet and exercise.  She has been losing weight with these efforts, while she is on SGLT2 inhibitor as well.

## 2014-11-11 NOTE — Assessment & Plan Note (Signed)
Appears clinically euthyroid today. She will get her most recent labs from detox center. Will obtain notes from Dr Renae FicklePaul.  She will continue current dose of levothyroxine for now.  Will confirm with her pharmacy about her levothyroxine dose, and compliance wrt refills.

## 2014-11-11 NOTE — Assessment & Plan Note (Signed)
By report, her sugars are at target. I have asked her to check her sugars 2 x daily at various time points. New meter( one touch mini) given to patient along with rx for testing supplies.   Discussed importance of diet and exercise. She will get back to mindful eating and avoid stress eating.  Will obtain recent labs from Dr Renae FicklePaul. The patient is also going to drop off the recent labs from detox center for review.   She can continue current medications for now, assuming that her GFR is in acceptable range still. Last GFR 2014 was low.   Discussed foot care, eye exams and hypoglycemia.   RTC 2 months

## 2014-11-11 NOTE — Assessment & Plan Note (Signed)
Currently on statin. Obtain last lipid testing.

## 2014-11-26 ENCOUNTER — Other Ambulatory Visit: Payer: Self-pay | Admitting: Nurse Practitioner

## 2014-12-08 ENCOUNTER — Encounter: Payer: Self-pay | Admitting: Nurse Practitioner

## 2014-12-08 ENCOUNTER — Ambulatory Visit (INDEPENDENT_AMBULATORY_CARE_PROVIDER_SITE_OTHER): Payer: Self-pay | Admitting: Nurse Practitioner

## 2014-12-08 VITALS — BP 120/60 | HR 82 | Temp 98.2°F | Resp 12 | Ht 65.0 in | Wt 224.8 lb

## 2014-12-08 DIAGNOSIS — F1911 Other psychoactive substance abuse, in remission: Secondary | ICD-10-CM

## 2014-12-08 DIAGNOSIS — E669 Obesity, unspecified: Secondary | ICD-10-CM

## 2014-12-08 DIAGNOSIS — Z87898 Personal history of other specified conditions: Secondary | ICD-10-CM

## 2014-12-08 DIAGNOSIS — R21 Rash and other nonspecific skin eruption: Secondary | ICD-10-CM

## 2014-12-08 MED ORDER — ESOMEPRAZOLE MAGNESIUM 40 MG PO CPDR: 40.0000 mg | DELAYED_RELEASE_CAPSULE | Freq: Two times a day (BID) | ORAL | Status: DC

## 2014-12-08 MED ORDER — NYSTATIN 100000 UNIT/GM EX CREA: 1.0000 "application " | TOPICAL_CREAM | Freq: Two times a day (BID) | CUTANEOUS | Status: DC

## 2014-12-08 MED ORDER — ATORVASTATIN CALCIUM 10 MG PO TABS: 10.0000 mg | ORAL_TABLET | Freq: Every day | ORAL | Status: DC

## 2014-12-08 MED ORDER — LISINOPRIL 10 MG PO TABS: 10.0000 mg | ORAL_TABLET | Freq: Every day | ORAL | Status: DC

## 2014-12-08 MED ORDER — FLUTICASONE PROPIONATE 50 MCG/ACT NA SUSP: 2.0000 | Freq: Every day | NASAL | Status: DC

## 2014-12-08 MED ORDER — QUETIAPINE FUMARATE 100 MG PO TABS: 100.0000 mg | ORAL_TABLET | Freq: Every day | ORAL | Status: DC

## 2014-12-08 MED ORDER — CITALOPRAM HYDROBROMIDE 20 MG PO TABS: 40.0000 mg | ORAL_TABLET | Freq: Every day | ORAL | Status: DC

## 2014-12-08 MED ORDER — DAPAGLIFLOZIN PROPANEDIOL 5 MG PO TABS: 5.0000 mg | ORAL_TABLET | Freq: Every day | ORAL | Status: DC

## 2014-12-08 MED ORDER — TRAZODONE HCL 100 MG PO TABS: 100.0000 mg | ORAL_TABLET | Freq: Every day | ORAL | Status: DC

## 2014-12-08 MED ORDER — METHOCARBAMOL 500 MG PO TABS: 500.0000 mg | ORAL_TABLET | Freq: Three times a day (TID) | ORAL | Status: DC

## 2014-12-08 MED ORDER — METFORMIN HCL 1000 MG PO TABS: 1000.0000 mg | ORAL_TABLET | Freq: Two times a day (BID) | ORAL | Status: DC

## 2014-12-08 NOTE — Progress Notes (Signed)
Subjective:    Patient ID: Dawn Dean, female    DOB: 03-28-1961, 54 y.o.   MRN: [id number]  HPI Ms. Dawn Dean is a 54 yo female with a CC of needing refills, yeast, and paperwork.  1) Refills on all except strips needed today.   2) Yeast in inguinal folds- started last night, red, itchy. Noticed when in bath tub.   3) Out of work for 3 more months   Needs paperwork filled out for insurance purposes.  * Update- Pt requested by staff to pick up form and give to psychiatrist to fill out since I am not diagnosing provider.   4) "Still clean" she reports, would like to work her way off of seroquel  Making appointment with addiction specialist.   Review of Systems  Constitutional: Negative for fever, chills, diaphoresis and fatigue.  Eyes: Negative for visual disturbance.  Respiratory: Negative for chest tightness, shortness of breath and wheezing.   Cardiovascular: Negative for chest pain, palpitations and leg swelling.  Gastrointestinal: Negative for nausea, vomiting, diarrhea and rectal pain.  Skin: Positive for rash.       In inguinal folds  Neurological: Negative for dizziness, weakness, numbness and headaches.  Psychiatric/Behavioral: The patient is not nervous/anxious.    Past Medical History  Diagnosis Date  . Hypothyroidism   . Anxiety   . Mental disorder   . Diabetes mellitus without complication     diagnosed -20 yrs. ago   . GERD (gastroesophageal reflux disease)   . Headache(784.0)     History   Social History  . Marital Status: Married    Spouse Name: [name]    Number of Children: [name]  . Years of Education: [name]   Occupational History  . Not on file.   Social History Main Topics  . Smoking status: Former Smoker    Quit date: 12/05/1993  . Smokeless tobacco: Never Used  . Alcohol Use: No     Comment: very seldom   . Drug Use: No  . Sexual Activity:    Partners: Male   Other Topics Concern  . 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 Joint replacement      L knee  . Knee arthroscopy      L knee  . Sinus surgery      for deviated septum  . Cesarean section      1985  . Dilation and curettage of uterus    . Cervical discectomy  12/05/2012  . Anterior cervical decomp/discectomy fusion  12/05/2012    Procedure: ANTERIOR CERVICAL DECOMPRESSION/DISCECTOMY FUSION 1 LEVEL;  Surgeon: Venita Lickahari Brooks, MD;  Location: [facility] OR;  Service: Orthopedics;  Laterality: [name];  ACDF C5-6    Family History  Problem Relation Age of Onset  . Diabetes Cousin   . Heart disease Other     No Known Allergies  Current Outpatient Prescriptions on File Prior to Visit  Medication Sig Dispense Refill  . docusate sodium (COLACE) 100 MG capsule Take 1 capsule (100 mg total) by mouth 3 (three) times daily  as needed for constipation. 30 capsule 0  . gabapentin (NEURONTIN) 400 MG capsule TAKE 1 CAPSULE (400 MG TOTAL) BY MOUTH 3 (THREE) TIMES DAILY. 90 capsule 5  . glucose blood (ONE TOUCH ULTRA TEST) test strip Test sugars 2x daily 100 each 12  . ibuprofen (ADVIL,MOTRIN) 200 MG tablet Take 600 mg by mouth every 6 (six) hours as needed.    . Multiple Vitamins-Minerals (CENTRUM SILVER ADULT 50+ PO) Take 1 tablet by mouth daily.    . Omega-3 Fatty Acids (FISH OIL) 300 MG CAPS Take 3 capsules by mouth daily.    . ondansetron (ZOFRAN) 4 MG tablet Take 1 tablet (4 mg total) by mouth every 8 (eight) hours as needed for nausea. 20 tablet 0  . ONETOUCH DELICA LANCETS 33G MISC Test sugars 2x daily 100 each 11   No current facility-administered medications on file prior to visit.       Objective:   Physical Exam  Constitutional: She is oriented to  person, place, and time. She appears well-developed and well-nourished. No distress.  Eyes: Conjunctivae and EOM are normal. Pupils are equal, round, and reactive to light. Right eye exhibits no discharge. Left eye exhibits no discharge. No scleral icterus.  Cardiovascular: Normal rate and regular rhythm.   Pulmonary/Chest: Effort normal and breath sounds normal.  Neurological: She is alert and oriented to person, place, and time. She exhibits normal muscle tone. Coordination normal.  Skin: Skin is warm and dry. She is not diaphoretic.  Psychiatric: She has a normal mood and affect. Her behavior is normal. Judgment and thought content normal.   BP 120/60 mmHg  Pulse 82  Temp(Src) 98.2 F (36.8 C) (Oral)  Resp 12  Ht  (1.651 m)  Wt 224 lb 12.8 oz (101.969 kg)  BMI 37.41 kg/m2  SpO2 96%     Assessment & Plan:

## 2014-12-08 NOTE — Patient Instructions (Addendum)
We will follow up in 3 months. Please make appointment before leaving today.  Your refills were sent for 90 days to the pharmacy you requested.   Invokana is no longer covered on your formulary. We have switched it to a alternate called [place]Farxiga. Any questions let me know.   Flonase for helping open eustachian tube. 2 sprays in each nostril daily.   We will call you when your paperwork is filled out in 24-48 hours.   Your last fasting glucose indicates you are at risk for developing diabetes, so I am checking an A1c today   I want you to lose 25 lbs over the next six months with a low glycemic index diet and regular exercise (30 minutes of cardio 5 days per week is your goal)  This is  my version of a  "Low GI"  Diet:  It will still lower your blood sugars and allow you to lose 4 to 8  lbs  per month if you follow it carefully.  Your goal with exercise is a minimum of 30 minutes of aerobic exercise 5 days per week (Walking does not count once it becomes easy!)     All of the foods can be found at grocery stores and in bulk at [employer].  The Atkins protein bars and shakes are available in more varieties at Target, WalMart and Lowe's Foods.     7 AM Breakfast:  Choose from the following:  Low carbohydrate Protein  Shakes (I recommend the EAS AdvantEdge "Carb Control" shakes  Or the low carb shakes by Atkins.    2.5 carbs   Arnold's "Sandwhich Thin"toasted  w/ peanut butter (no jelly: about 20 net carbs  "Bagel Thin" with cream cheese and salmon: about 20 carbs   a scrambled egg/bacon/cheese burrito made with Mission's "carb balance" whole wheat tortilla  (about 10 net carbs )  A slice of home made fritatta (egg based dish without a crust:  google it)    Avoid cereal and bananas, oatmeal and cream of wheat and grits. They are loaded with carbohydrates!   10 AM: high protein snack  Protein bar by Atkins (the snack size, under 200 cal, usually < 6 net carbs).    A stick of cheese:  Around  1 carb,  100 cal     Dannon Light n Fit AustriaGreek Yogurt  (80 cal, 8 carbs)  Other so called "protein bars" and Greek yogurts tend to be loaded with carbohydrates.  Remember, in food advertising, the word "energy" is synonymous for " carbohydrate."  Lunch:   A Sandwich using the bread choices listed, Can use any  Eggs,  lunchmeat, grilled meat or canned tuna), avocado, regular mayo/mustard  and cheese.  A Salad using blue cheese, ranch,  Goddess or vinagrette,  No croutons or "confetti" and no "candied nuts" but regular nuts OK.   No pretzels or chips.  Pickles and miniature sweet peppers are a good low carb alternative that provide a "crunch"  The bread is the only source of carbohydrate in a sandwich and  can be decreased by trying some of these alternatives to traditional loaf bread  Joseph's makes a pita bread and a flat bread that are 50 cal and 4 net carbs available at BJs and WalMart.  This can be toasted to use with hummous as well  Toufayan makes a low carb flatbread that's 100 cal and 9 net carbs available at [employer] and [employer]  Mission makes 2 sizes  of  Low carb whole wheat tortilla  (The large one is 210 cal and 6 net carbs)  Flat Out makes flatbreads that are low carb as well  Avoid "Low fat dressings, as well as Reyne Dumas and [address] dressings They are loaded with sugar!   3 PM/ Mid day  Snack:  Consider  1 ounce of  almonds, walnuts, pistachios, pecans, peanuts,  Macadamia nuts or a nut medley.  Avoid "granola"; the dried cranberries and raisins are loaded with carbohydrates. Mixed nuts as long as there are no raisins,  cranberries or dried fruit.    Try the prosciutto/mozzarella cheese sticks by Fiorruci  In deli /backery section   High protein   To avoid overindulging in snacks: Try drinking a glass of unsweeted almond/coconut milk  Or a cup of coffee with your Atkins chocolate bar to keep you from having 3!!!   Pork rinds!  Yes Pork Rinds        6 PM  Dinner:      Meat/fowl/fish with a green salad, and either broccoli, cauliflower, green beans, spinach, brussel sprouts or  Lima beans. DO NOT BREAD THE PROTEIN!!      There is a low carb pasta by Dreamfield's that is acceptable and tastes great: only 5 digestible carbs/serving.( All grocery stores but BJs carry it )  Try Kai Levins Angelo's chicken piccata or chicken or eggplant parm over low carb pasta.(Lowes and BJs)   Clifton Custard Sanchez's "Carnitas" (pulled pork, no sauce,  0 carbs) or his beef pot roast to make a dinner burrito (at BJ's)  Pesto over low carb pasta (bj's sells a good quality pesto in the center refrigerated section of the deli   Try satueeing  Roosvelt Harps with mushroooms  Whole wheat pasta is still full of digestible carbs and  Not as low in glycemic index as Dreamfield's.   Brown rice is still rice,  So skip the rice and noodles if you eat [place] or [place] (or at least limit to 1/2 cup)  9 PM snack :   Breyer's "low carb" fudgsicle or  ice cream bar (Carb Smart line), or  Weight Watcher's ice cream bar , or another "no sugar added" ice cream;  a serving of fresh berries/cherries with whipped cream   Cheese or DANNON'S LlGHT N FIT GREEK YOGURT or the Oikos greek yogurt   8 ounces of [place] unsweetened almond/cococunut milk  Cheese and crackers (using WASA crackers,  They are low carb) or peanut butter on low carb crackers or pita bread     Avoid bananas, pineapple, grapes  and watermelon on a regular basis because they are high in sugar.  THINK OF THEM AS DESSERT  Remember that snack Substitutions should be less than 10 NET carbs per serving and meals should be < 25 net carbs. Remember that carbohydrates from fiber do not affect blood sugar, so you can  subtract fiber grams to get the "net carbs " of any particular food item.

## 2014-12-08 NOTE — Progress Notes (Signed)
Pre visit review using our clinic review tool, if applicable. No additional management support is needed unless otherwise documented below in the visit note. 

## 2014-12-09 ENCOUNTER — Other Ambulatory Visit: Payer: Self-pay | Admitting: Endocrinology

## 2014-12-09 ENCOUNTER — Telehealth: Payer: Self-pay

## 2014-12-09 DIAGNOSIS — E039 Hypothyroidism, unspecified: Secondary | ICD-10-CM

## 2014-12-09 MED ORDER — LEVOTHYROXINE SODIUM 100 MCG PO TABS: 100.0000 ug | ORAL_TABLET | Freq: Every day | ORAL | Status: DC

## 2014-12-09 NOTE — Telephone Encounter (Signed)
Notified patient to pick up documentation.  Patient verbalized understanding and will pick up before the end of the week.

## 2014-12-10 DIAGNOSIS — R21 Rash and other nonspecific skin eruption: Secondary | ICD-10-CM | POA: Insufficient documentation

## 2014-12-10 NOTE — Assessment & Plan Note (Signed)
She is still going through the program as mentioned in overview. She is making an appointment with an addiction specialist.

## 2014-12-10 NOTE — Assessment & Plan Note (Signed)
Stable. Low glycemic index diet plan given to pt in handout. She is seeing Dr. Welford RochePhadke for endocrinology.

## 2014-12-10 NOTE — Assessment & Plan Note (Signed)
Stable. Rash in inguinal folds presumed to be yeast. Will try Nystatin cream and fu if worsening or not improved.

## 2014-12-21 ENCOUNTER — Other Ambulatory Visit: Payer: Self-pay

## 2014-12-21 MED ORDER — GABAPENTIN 400 MG PO CAPS: ORAL_CAPSULE | ORAL | Status: DC

## 2015-01-07 ENCOUNTER — Encounter: Payer: Self-pay | Admitting: Nurse Practitioner

## 2015-01-07 ENCOUNTER — Encounter (INDEPENDENT_AMBULATORY_CARE_PROVIDER_SITE_OTHER): Payer: Self-pay

## 2015-01-07 ENCOUNTER — Ambulatory Visit (INDEPENDENT_AMBULATORY_CARE_PROVIDER_SITE_OTHER): Payer: BLUE CROSS/BLUE SHIELD | Admitting: Nurse Practitioner

## 2015-01-07 VITALS — BP 126/74 | HR 85 | Temp 98.6°F | Resp 14 | Ht 65.0 in | Wt 227.1 lb

## 2015-01-07 DIAGNOSIS — J069 Acute upper respiratory infection, unspecified: Secondary | ICD-10-CM | POA: Insufficient documentation

## 2015-01-07 MED ORDER — AMOXICILLIN-POT CLAVULANATE 875-125 MG PO TABS: 1.0000 | ORAL_TABLET | Freq: Two times a day (BID) | ORAL | Status: DC

## 2015-01-07 NOTE — Patient Instructions (Signed)
Upper Respiratory Infection, Adult An upper respiratory infection (URI) is also sometimes known as the common cold. The upper respiratory tract includes the nose, sinuses, throat, trachea, and bronchi. Bronchi are the airways leading to the lungs. Most people improve within 1 week, but symptoms can last up to 2 weeks. A residual cough may last even longer.  CAUSES Many different viruses can infect the tissues lining the upper respiratory tract. The tissues become irritated and inflamed and often become very moist. Mucus production is also common. A cold is contagious. You can easily spread the virus to others by oral contact. This includes kissing, sharing a glass, coughing, or sneezing. Touching your mouth or nose and then touching a surface, which is then touched by another person, can also spread the virus. SYMPTOMS  Symptoms typically develop 1 to 3 days after you come in contact with a cold virus. Symptoms vary from person to person. They may include:  Runny nose.  Sneezing.  Nasal congestion.  Sinus irritation.  Sore throat.  Loss of voice (laryngitis).  Cough.  Fatigue.  Muscle aches.  Loss of appetite.  Headache.  Low-grade fever. DIAGNOSIS  You might diagnose your own cold based on familiar symptoms, since most people get a cold 2 to 3 times a year. Your caregiver can confirm this based on your exam. Most importantly, your caregiver can check that your symptoms are not due to another disease such as strep throat, sinusitis, pneumonia, asthma, or epiglottitis. Blood tests, throat tests, and X-rays are not necessary to diagnose a common cold, but they may sometimes be helpful in excluding other more serious diseases. Your caregiver will decide if any further tests are required. RISKS AND COMPLICATIONS  You may be at risk for a more severe case of the common cold if you smoke cigarettes, have chronic heart disease (such as heart failure) or lung disease (such as asthma), or if  you have a weakened immune system. The very young and very old are also at risk for more serious infections. Bacterial sinusitis, middle ear infections, and bacterial pneumonia can complicate the common cold. The common cold can worsen asthma and chronic obstructive pulmonary disease (COPD). Sometimes, these complications can require emergency medical care and may be life-threatening. PREVENTION  The best way to protect against getting a cold is to practice good hygiene. Avoid oral or hand contact with people with cold symptoms. Wash your hands often if contact occurs. There is no clear evidence that vitamin C, vitamin E, echinacea, or exercise reduces the chance of developing a cold. However, it is always recommended to get plenty of rest and practice good nutrition. TREATMENT  Treatment is directed at relieving symptoms. There is no cure. Antibiotics are not effective, because the infection is caused by a virus, not by bacteria. Treatment may include:  Increased fluid intake. Sports drinks offer valuable electrolytes, sugars, and fluids.  Breathing heated mist or steam (vaporizer or shower).  Eating chicken soup or other clear broths, and maintaining good nutrition.  Getting plenty of rest.  Using gargles or lozenges for comfort.  Controlling fevers with ibuprofen or acetaminophen as directed by your caregiver.  Increasing usage of your inhaler if you have asthma. Zinc gel and zinc lozenges, taken in the first 24 hours of the common cold, can shorten the duration and lessen the severity of symptoms. Pain medicines may help with fever, muscle aches, and throat pain. A variety of non-prescription medicines are available to treat congestion and runny nose. Your caregiver   can make recommendations and may suggest nasal or lung inhalers for other symptoms.  HOME CARE INSTRUCTIONS   Only take over-the-counter or prescription medicines for pain, discomfort, or fever as directed by your  caregiver.  Use a warm mist humidifier or inhale steam from a shower to increase air moisture. This may keep secretions moist and make it easier to breathe.  Drink enough water and fluids to keep your urine clear or pale yellow.  Rest as needed.  Return to work when your temperature has returned to normal or as your caregiver advises. You may need to stay home longer to avoid infecting others. You can also use a face mask and careful hand washing to prevent spread of the virus. SEEK MEDICAL CARE IF:   After the first few days, you feel you are getting worse rather than better.  You need your caregiver's advice about medicines to control symptoms.  You develop chills, worsening shortness of breath, or brown or red sputum. These may be signs of pneumonia.  You develop yellow or brown nasal discharge or pain in the face, especially when you bend forward. These may be signs of sinusitis.  You develop a fever, swollen neck glands, pain with swallowing, or white areas in the back of your throat. These may be signs of strep throat. SEEK IMMEDIATE MEDICAL CARE IF:   You have a fever.  You develop severe or persistent headache, ear pain, sinus pain, or chest pain.  You develop wheezing, a prolonged cough, cough up blood, or have a change in your usual mucus (if you have chronic lung disease).  You develop sore muscles or a stiff neck. Document Released: 05/09/2001 Document Revised: 02/05/2012 Document Reviewed: 02/18/2014 Exit[employer] Patient Information 2015 [employer]. This information is not intended to replace advice given to you by your health care provider. Make sure you discuss any questions you have with your health care provider.  

## 2015-01-07 NOTE — Progress Notes (Signed)
Pre visit review using our clinic review tool, if applicable. No additional management support is needed unless otherwise documented below in the visit note. 

## 2015-01-07 NOTE — Assessment & Plan Note (Signed)
2 weeks worsening symptoms. Augmentin twice daily for 7 days. Continue motrin. FU prn worsening/failure to improve.

## 2015-01-07 NOTE — Progress Notes (Signed)
   Subjective:    Patient ID: Dawn Dean, female    DOB: 02-01-1961, 54 y.o.   MRN: [id number]  HPI  Ms. Dawn Dean is a 54 yo female with a CC of sore throat, chills, headache, yellow mucus x 2 weeks.   1) Robaxin- requesting taking it two pills instead of 1 3 x daily (how it was originally written for).   2) Two weeks ago nasal congestion, sneezing, throat was sore on Monday, night time temeperatures, green nasal drainage, headaches, chills, stomach is upset today.  Motrin- Helps the aches  Husband has similar symptoms x 1 month    Review of Systems  Constitutional: Positive for fever, chills and fatigue. Negative for diaphoresis.  HENT: Positive for congestion, rhinorrhea, sneezing and sore throat. Negative for ear discharge, ear pain, nosebleeds and sinus pressure.   Respiratory: Negative for chest tightness, shortness of breath and wheezing.   Cardiovascular: Negative for chest pain, palpitations and leg swelling.  Gastrointestinal: Positive for nausea. Negative for vomiting and diarrhea.  Skin: Negative for rash.  Neurological: Positive for headaches. Negative for dizziness, weakness and numbness.  Psychiatric/Behavioral: The patient is not nervous/anxious.        Objective:   Physical Exam  Constitutional: She is oriented to person, place, and time. She appears well-developed and well-nourished. No distress.  BP 126/74 mmHg  Pulse 85  Temp(Src) 98.6 F (37 C) (Oral)  Resp 14  Ht 5\' 5"  (1.651 m)  Wt 227 lb 1.9 oz (103.021 kg)  BMI 37.79 kg/m2  SpO2 95%   HENT:  Head: Normocephalic and atraumatic.  Right Ear: External ear normal.  Left Ear: External ear normal.  Red oropharyngeal area with clear drainage seen  Eyes: Conjunctivae and EOM are normal. Pupils are equal, round, and reactive to light. Right eye exhibits no discharge. Left eye exhibits no discharge. No scleral icterus.  Neck: Normal range of motion. Neck supple. No thyromegaly present.    Pulmonary/Chest: Effort normal and breath sounds normal. No respiratory distress. She has no wheezes. She has no rales. She exhibits no tenderness.  Lymphadenopathy:    She has no cervical adenopathy.  Neurological: She is alert and oriented to person, place, and time. No cranial nerve deficit. She exhibits normal muscle tone. Coordination normal.  Skin: Skin is warm and dry. No rash noted. She is not diaphoretic.  Psychiatric: She has a normal mood and affect. Her behavior is normal. Judgment and thought content normal.      Assessment & Plan:

## 2015-01-12 ENCOUNTER — Ambulatory Visit: Payer: Self-pay | Admitting: Endocrinology

## 2015-01-26 ENCOUNTER — Ambulatory Visit (INDEPENDENT_AMBULATORY_CARE_PROVIDER_SITE_OTHER): Payer: BLUE CROSS/BLUE SHIELD | Admitting: Endocrinology

## 2015-01-26 ENCOUNTER — Encounter: Payer: Self-pay | Admitting: Endocrinology

## 2015-01-26 ENCOUNTER — Other Ambulatory Visit: Payer: Self-pay | Admitting: Nurse Practitioner

## 2015-01-26 VITALS — BP 122/74 | HR 82 | Resp 14 | Ht 65.0 in | Wt 229.0 lb

## 2015-01-26 DIAGNOSIS — E785 Hyperlipidemia, unspecified: Secondary | ICD-10-CM

## 2015-01-26 DIAGNOSIS — E669 Obesity, unspecified: Secondary | ICD-10-CM

## 2015-01-26 DIAGNOSIS — E119 Type 2 diabetes mellitus without complications: Secondary | ICD-10-CM

## 2015-01-26 DIAGNOSIS — E039 Hypothyroidism, unspecified: Secondary | ICD-10-CM

## 2015-01-26 LAB — T4, FREE: FREE T4: 0.88 ng/dL (ref 0.60–1.60)

## 2015-01-26 LAB — COMPREHENSIVE METABOLIC PANEL
ALBUMIN: 4.5 g/dL (ref 3.5–5.2)
ALT: 24 U/L (ref 0–35)
AST: 22 U/L (ref 0–37)
Alkaline Phosphatase: 154 U/L — ABNORMAL HIGH (ref 39–117)
BUN: 26 mg/dL — AB (ref 6–23)
CALCIUM: 9.8 mg/dL (ref 8.4–10.5)
CO2: 27 mEq/L (ref 19–32)
Chloride: 106 mEq/L (ref 96–112)
Creatinine, Ser: 1.05 mg/dL (ref 0.40–1.20)
GFR: 57.71 mL/min — AB (ref >60.00)
Glucose, Bld: 237 mg/dL — ABNORMAL HIGH (ref 70–99)
POTASSIUM: 4.6 meq/L (ref 3.5–5.1)
Sodium: 140 mEq/L (ref 135–145)
Total Bilirubin: 0.3 mg/dL (ref 0.2–1.2)
Total Protein: 7.5 g/dL (ref 6.0–8.3)

## 2015-01-26 LAB — LIPID PANEL
CHOL/HDL RATIO: 5
Cholesterol: 194 mg/dL (ref 0–200)
HDL: 39.7 mg/dL (ref >39.00)
NonHDL: 154.3
TRIGLYCERIDES: 348 mg/dL — AB (ref 0.0–149.0)
VLDL: 69.6 mg/dL — ABNORMAL HIGH (ref 0.0–40.0)

## 2015-01-26 LAB — MICROALBUMIN / CREATININE URINE RATIO
Creatinine,U: 56.2 mg/dL
Microalb Creat Ratio: 1.2 mg/g (ref 0.0–30.0)
Microalb, Ur: <0.7 mg/dL (ref 0.0–1.9)

## 2015-01-26 LAB — TSH: TSH: 2.29 u[IU]/mL (ref 0.35–4.50)

## 2015-01-26 LAB — HEMOGLOBIN A1C: Hgb A1c MFr Bld: 10.2 % — ABNORMAL HIGH (ref 4.6–6.5)

## 2015-01-26 LAB — LDL CHOLESTEROL, DIRECT: Direct LDL: 100 mg/dL

## 2015-01-26 MED ORDER — IBUPROFEN 200 MG PO TABS: 600.0000 mg | ORAL_TABLET | Freq: Four times a day (QID) | ORAL | Status: DC | PRN

## 2015-01-26 NOTE — Assessment & Plan Note (Signed)
She will resume working on her diet and exercise.  She has been losing weight with these efforts, while she is on SGLT2 inhibitor as well. Now with recent stress eating- weight gain coming back.

## 2015-01-26 NOTE — Patient Instructions (Signed)
Check sugars 2 x daily ( before breakfast and before supper).  Record them in a log book and bring that/meter to next appointment.   Portion control and exercise regularly.  Labs today. Medication adjustment based on GFr from today.  Please come back for a follow-up appointment in 1 month.

## 2015-01-26 NOTE — Progress Notes (Signed)
Reason for visit-  Dawn Dean is a 54 y.o.-year-old female,  For f/u management of Type 2 diabetes, uncontrolled, without complications.  Last visit 2 months ago.  *Previously seen endocrine, Dr Renae Fickle, at [facility] -dismissed from [facility] practice due to substance abuse issues.    HPI- Patient has been diagnosed with diabetes in ~1995. Recalls being initially on lifestyle modifications.  Tried  Metformin, Invokana, Bydureon, Lantus , Glipizide/Glimeperide/Glyburide, Actos/Avandia, Bydureon/Tanzeum in the past.   * Didn't tolerate Tanzeum due to GI side effects ( nausea, gastritis). *taken off Lantus and Bydureon by [facility] due to lows about Sept 2015- Invokana dose reduced from 300 mg to 100 mg around that time *100+ days clean, now dealing with stress, anxiety and replaces emotions with food * Invokana 100 mg daily taken off due to insurance jan 2016 * recently took augmentin for cold  Pt is currently on a regimen of: - Metformin 1000 mg po bid -  Farxiga 5 mg daily- switch from Invokana in Jan 2016 due to insurance    Last hemoglobin A1c was: by report 7.1% Oct 2015 - I don't have these and the patient will bring these for review.  Lab Results  Component Value Date   HGBA1C 7.5* 09/28/2014   HGBA1C 8.5* 05/19/2014   HGBA1C 9.4* 12/05/2012     Pt checks her sugars 2x a day .  Uses  One Touch mini glucometer.  By download they are:  PREMEAL Breakfast Lunch Dinner Bedtime Overall  Glucose range: 155-301 189-367  200-465   Mean/median:        POST-MEAL PC Breakfast PC Lunch PC Dinner  Glucose range:     Mean/median:       Hypoglycemia-  No recent lows. she has hypoglycemia awareness at 13.   Dietary habits- usually eats three times daily- not eating BF these days, stress eating a lot  . Tries to limit carbs, sweetened beverages, sodas, desserts. Drinks diet sodas, unsweet tea, occasional chocolate>.done worse with eating habits now Exercise- walks 20 min  daily of TD o/w walks her dogs for 30 minutes>>started back on this after initial halt Weight -  Recently lot of overeating Wt Readings from Last 3 Encounters:  01/26/15 229 lb (103.874 kg)  01/07/15 227 lb 1.9 oz (103.021 kg)  12/08/14 224 lb 12.8 oz (101.969 kg)    Diabetes Complications-  Nephropathy- No  CKD, last BUN/creatinine- GFR 48- but per patient no CKD on recent labs, which are awaited Lab Results  Component Value Date   BUN 16 12/04/2012   CREATININE 1.25* 12/04/2012   No results found for: GFR  No results found for: MICRALBCREAT    Retinopathy- No, Last DEE was this year~Feb Neuropathy- no numbness and tingling in her feet. No known neuropathy. Taking neurontin for her "mood" per patient. Associated history - No CAD . No prior stroke.   Hypothyroidism- Has hypothyroidism that was diagnosed about 2005. Last dose change of LT4 ~1 year ago. Currently taking levothyroxine, dose of 100 mcg daily. Taking it separate from food, and her pills and MVI.   her last TSH was  Lab Results  Component Value Date   TSH 5.83 09/28/2014   Review of systems: [ x  ] complains of    [  ] denies [ x ] weight gain-diet related  [  ] constipation  [  ] fatigue  [  ] dry skin  [  ] cold intolerance [  ] hair changes [  ]  noticing any enlargement in size of thyroid [  ] lumps in neck [  ] dysphagia   Hyperlipidemia-  her last set of lipids were- Currently on Lipitor  daily. Tolerating well.   Lab Results  Component Value Date   CHOL 297* 09/28/2014   HDL 35 09/28/2014   LDLCALC 89 05/19/2014   TRIG 269* 05/19/2014    Blood Pressure/HTN- Patient's blood pressure is well controlled today on current regimen that includes ACE-I ( Lisinopril).  I have reviewed the patient's past medical history,  medications and allergies.   Current Outpatient Prescriptions on File Prior to Visit  Medication Sig Dispense Refill  . atorvastatin (LIPITOR) 10 MG tablet Take 1 tablet (10 mg total) by  mouth at bedtime. 90 tablet 2  . citalopram (CELEXA) 20 MG tablet Take 2 tablets (40 mg total) by mouth at bedtime. Take 40 mg by mouth daily at bedtime 180 tablet 2  . dapagliflozin propanediol (FARXIGA) 5 MG TABS tablet Take 5 mg by mouth daily. 90 tablet 0  . docusate sodium (COLACE) 100 MG capsule Take 1 capsule (100 mg total) by mouth 3 (three) times daily as needed for constipation. 30 capsule 0  . esomeprazole (NEXIUM) 40 MG capsule Take 1 capsule (40 mg total) by mouth 2 (two) times daily with a meal. 180 capsule 2  . fluticasone (FLONASE) 50 MCG/ACT nasal spray Place 2 sprays into both nostrils daily. 16 g 6  . gabapentin (NEURONTIN) 400 MG capsule TAKE 1 CAPSULE (400 MG TOTAL) BY MOUTH 3 (THREE) TIMES DAILY. 270 capsule 1  . glucose blood (ONE TOUCH ULTRA TEST) test strip Test sugars 2x daily 100 each 12  . levothyroxine (SYNTHROID, LEVOTHROID) 100 MCG tablet Take 1 tablet (100 mcg total) by mouth daily. 30 tablet 3  . lisinopril (PRINIVIL,ZESTRIL) 10 MG tablet Take 1 tablet (10 mg total) by mouth daily. 90 tablet 2  . metFORMIN (GLUCOPHAGE) 1000 MG tablet Take 1 tablet (1,000 mg total) by mouth 2 (two) times daily with a meal. 180 tablet 2  . methocarbamol (ROBAXIN) 500 MG tablet Take 1 tablet (500 mg total) by mouth 3 (three) times daily. (Patient taking differently: Take 500 mg by mouth 3 (three) times daily. Pt taking two pills 3 x a day) 90 tablet 0  . Multiple Vitamins-Minerals (CENTRUM SILVER ADULT 50+ PO) Take 1 tablet by mouth daily.    Marland Kitchen nystatin cream (MYCOSTATIN) Apply 1 application topically 2 (two) times daily. 30 g 0  . Omega-3 Fatty Acids (FISH OIL) 300 MG CAPS Take 3 capsules by mouth daily.    . ondansetron (ZOFRAN) 4 MG tablet Take 1 tablet (4 mg total) by mouth every 8 (eight) hours as needed for nausea. 20 tablet 0  . ONETOUCH DELICA LANCETS 33G MISC Test sugars 2x daily 100 each 11  . QUEtiapine (SEROQUEL) 100 MG tablet Take 1 tablet (100 mg total) by mouth at  bedtime. 90 tablet 2  . traZODone (DESYREL) 100 MG tablet Take 1 tablet (100 mg total) by mouth at bedtime. 90 tablet 2   No current facility-administered medications on file prior to visit.   No Known Allergies   Review of Systems- [ x ]  Complains of    [  ]  denies [x  ] Recent weight change [  ]  Fatigue [  ] polydipsia [ x ] polyuria [ x ]  nocturia [  ]  vision difficulty [  ] chest pain [  ] shortness of breath [  ]  leg swelling [  ] cough [  ] nausea/vomiting [  ] diarrhea [  ] constipation [  ] abdominal pain [  ]  tingling/numbness in extremities [  ]  concern with feet ( wounds/sores)   PE: BP 122/74 mmHg  Pulse 82  Resp 14  Ht 5\' 5"  (1.651 m)  Wt 229 lb (103.874 kg)  BMI 38.11 kg/m2  SpO2 95% Wt Readings from Last 3 Encounters:  01/26/15 229 lb (103.874 kg)  01/07/15 227 lb 1.9 oz (103.021 kg)  12/08/14 224 lb 12.8 oz (101.969 kg)   Exam: deferred  ASSESSMENT AND PLAN: Problem List Items Addressed This Visit      Endocrine   Diabetes mellitus type II, controlled - Primary    Checking sugars regularly now- congratulated on effort. Update A1c today.  Sugars are very elevated secondary to recent noncompliance with diet.   Discussed importance of diet and exercise. She will get back to mindful eating and avoid stress eating. Try to engage in a hobby rather than eating.   Didn't get pertinent recent labs from detox center for review- will obtain them today.   Continue current medications. Based on GFR from today, will most likely Increase farxiga to 10 mg daily. If GFR is lower side, then plan to restart injectable therapy with Bydureon versus insulin.         Relevant Orders   TSH   T4, free   Comprehensive metabolic panel   Hemoglobin A1c   Microalbumin / creatinine urine ratio   Lipid panel   Hypothyroidism    Appears clinically euthyroid today. She will continue current dose of levothyroxine for now. Update TSH, free T4 and adjust dose based on labs.           Relevant Orders   TSH   T4, free   Comprehensive metabolic panel   Hemoglobin A1c   Microalbumin / creatinine urine ratio   Lipid panel     Other   Obesity (BMI 30-39.9)    She will resume working on her diet and exercise.  She has been losing weight with these efforts, while she is on SGLT2 inhibitor as well. Now with recent stress eating- weight gain coming back.         Relevant Orders   TSH   T4, free   Comprehensive metabolic panel   Hemoglobin A1c   Microalbumin / creatinine urine ratio   Lipid panel   Hyperlipidemia    Currently on statin. Obtain  lipid testing today.        Relevant Orders   TSH   T4, free   Comprehensive metabolic panel   Hemoglobin A1c   Microalbumin / creatinine urine ratio   Lipid panel        - Return to clinic in 1 mo with sugar log/meter.  Tereasa Yilmaz [facility]AR 01/26/2015 11:41 AM

## 2015-01-26 NOTE — Assessment & Plan Note (Signed)
Currently on statin. Obtain  lipid testing today.

## 2015-01-26 NOTE — Assessment & Plan Note (Signed)
Checking sugars regularly now- congratulated on effort. Update A1c today.  Sugars are very elevated secondary to recent noncompliance with diet.   Discussed importance of diet and exercise. She will get back to mindful eating and avoid stress eating. Try to engage in a hobby rather than eating.   Didn't get pertinent recent labs from detox center for review- will obtain them today.   Continue current medications. Based on GFR from today, will most likely Increase farxiga to 10 mg daily. If GFR is lower side, then plan to restart injectable therapy with Bydureon versus insulin.

## 2015-01-26 NOTE — Assessment & Plan Note (Signed)
Appears clinically euthyroid today. She will continue current dose of levothyroxine for now. Update TSH, free T4 and adjust dose based on labs.

## 2015-01-27 ENCOUNTER — Telehealth: Payer: Self-pay | Admitting: Endocrinology

## 2015-01-27 ENCOUNTER — Other Ambulatory Visit: Payer: Self-pay | Admitting: Endocrinology

## 2015-01-27 MED ORDER — INSULIN PEN NEEDLE 32G X 4 MM MISC: Status: DC

## 2015-01-27 MED ORDER — INSULIN GLARGINE 100 UNIT/ML SOLOSTAR PEN: 20.0000 [IU] | PEN_INJECTOR | Freq: Every day | SUBCUTANEOUS | Status: DC

## 2015-01-27 NOTE — Telephone Encounter (Signed)
Spoke to pt, see result note.

## 2015-01-27 NOTE — Telephone Encounter (Signed)
The patient wants to know what type of sliding scale she should be on for her insuline.

## 2015-02-16 ENCOUNTER — Telehealth: Payer: Self-pay | Admitting: Nurse Practitioner

## 2015-02-16 NOTE — Telephone Encounter (Signed)
Spoke to pt, advised she can schedule her own mammogram, will call for her own appointment. States she needs a letter written to [employer] about meds she takes and some additional information. Pt will drop off information regarding this for Lyla SonCarrie to complete.

## 2015-02-16 NOTE — Telephone Encounter (Signed)
Patient stated that she need you to fill out a letter for her and she need to make appt for a mamogram. Please advise

## 2015-02-17 NOTE — Telephone Encounter (Signed)
Instructions for letter placed in Carrie's red folder for completion.

## 2015-02-17 NOTE — Telephone Encounter (Signed)
Noted. Will work on it and be ready most likely Monday Thanks!

## 2015-02-18 ENCOUNTER — Encounter: Payer: Self-pay | Admitting: Nurse Practitioner

## 2015-02-18 NOTE — Telephone Encounter (Signed)
Left message, notifying pt letter and papers ready for pickup.

## 2015-02-25 ENCOUNTER — Ambulatory Visit
Admit: 2015-02-25 | Disposition: A | Discharge status: Home / Self Care | Payer: Self-pay | Attending: Nurse Practitioner | Admitting: Nurse Practitioner

## 2015-03-02 ENCOUNTER — Ambulatory Visit: Payer: Self-pay | Admitting: Endocrinology

## 2015-03-10 ENCOUNTER — Telehealth: Payer: Self-pay | Admitting: *Deleted

## 2015-03-10 NOTE — Telephone Encounter (Signed)
Please advise.  Last seen by you 2/11.

## 2015-03-11 NOTE — Telephone Encounter (Signed)
Can up to maximum 200 mg a day. So if she is taking the 100 mg tablets take 2 at night. I can also call in a 50 mg to make a 150 mg. Let me know, Thanks!

## 2015-03-12 ENCOUNTER — Telehealth: Payer: Self-pay

## 2015-03-12 ENCOUNTER — Other Ambulatory Visit: Payer: Self-pay | Admitting: Nurse Practitioner

## 2015-03-12 MED ORDER — TRAZODONE HCL 50 MG PO TABS: 25.0000 mg | ORAL_TABLET | Freq: Every evening | ORAL | Status: DC | PRN

## 2015-03-12 NOTE — Telephone Encounter (Signed)
Sent!

## 2015-03-12 NOTE — Telephone Encounter (Signed)
Pt would like to start with the 50mg  and 90 day supply if ok. Will pend.

## 2015-03-19 NOTE — H&P (Signed)
PATIENT NAME:  Dawn Dean, Dawn Dean MR#:  [id number] DATE OF BIRTH:  Feb 23, 1961  PRIMARY CARE PHYSICIAN: Aram Beecham, MD.   CHIEF COMPLAINT: Syncope and not feeling well.   HISTORY OF PRESENT ILLNESS: A 54 year old obese Caucasian female patient with history of diabetes, hypothyroidism and chronic pain on narcotics, who presents to the Emergency Room after the patient had an episode of syncope at work. The patient is a Manufacturing systems engineer. The patient was sitting at her desk, tried to stand up, felt dizzy, and then had to sit down and blacked out.   The patient presently feels a little restless and strange after she got a Narcan dose. The patient in the Emergency Room has been found to be hypotensive, into the 70s, had 2 liters of normal saline given, then started on dopamine, with which her blood pressure increased to 160s, and now the dopamine has been stopped.   The patient denies taking any extra pain medications. She did take a muscle relaxant yesterday night which she has not taken in a long time, although she mentions that she has not been feeling well for a week. No fever or rash, dysuria, shortness of breath. She did have worsening neck pain over the past few weeks which is chronic, also radiating pain into her left arm, which she feels is likely from her neck pain.   The only new medication she is taking is Byetta for her diabetes started by her endocrinologist. She was recently treated for a UTI 2 weeks back by Dr. Judithann Sheen.   PAST MEDICAL HISTORY:  1.  Diabetes mellitus.  2.  Hypothyroidism.  3.  Hypertension.  4.  Obesity.  5.  Chronic insomnia.  6.  Hyperlipidemia.  7.  Left knee surgery.  8.  Gastritis.  9.  Chronic thrombocytopenia.  10.  Osteoarthritis.   ALLERGIES: DARVOCET.   SOCIAL HISTORY: The patient does not smoke. No alcohol. No illicit drugs. Works as a Therapist, music.   CODE STATUS: FULL CODE.   FAMILY HISTORY: Positive for asthma and diabetes.    REVIEW OF SYSTEMS:  CONSTITUTIONAL: Complains of fatigue and weakness.  EYES: No blurred vision, pain, redness.  ENT: No tinnitus, ear pain, hearing loss.  RESPIRATORY:  No cough, wheeze, hemoptysis.  CARDIOVASCULAR: No chest pain, orthopnea. Had syncope.  GASTROINTESTINAL: No nausea or vomiting, diarrhea, abdominal pain.  GENITOURINARY: No dysuria, hematuria, or frequency.  ENDOCRINE: No polyuria, nocturia, thyroid problems.  HEMOLYMPHATIC: No anemia, easy bruising, bleeding. Has chronic thrombocytopenia.  INTEGUMENTARY: No acne, rash, lesions.  MUSCULOSKELETAL: Has chronic neck pain.  NEUROLOGIC: No focal numbness, weakness, seizures.  PSYCHIATRIC: Depression.   HOME MEDICATIONS:  Include:  1.  Lipitor 10 mg daily.  2.  Byetta 10 mcg subcutaneous 2 times a day.  3.  Citalopram 20 mg daily.  4.  Invokana 300 mg oral once a day.  5.  Levothyroxine 100 mcg oral once a day.  6.  Lorazepam 0.5 mg oral once a day in the morning, 1 tablet at night.  7.  Metformin 1000 mg oral 2 times a day.  8.  Nexium 40 mg oral 2 times a day.  9.  Percocet 10/325 one tablet oral every 4 hours as needed for pain.  10.  ProAir HFA 2 puffs inhaled every 4 hours as needed for shortness of breath.   PHYSICAL EXAMINATION:  VITAL SIGNS: Temperature of 98.4, blood pressure 80/44. Presently at 155/56 with dopamine.  GENERAL: Obese Caucasian female patient lying in bed, is  alert and awake.  PSYCHIATRIC: Pleasant. Mood and affect appropriate.  HEENT: Atraumatic, normocephalic. Oral mucosa dry and pink. External ears and nose normal. No pallor. No icterus.  NECK: Supple. No thyromegaly. No palpable lymph nodes. Trachea midline. No JVD.  CARDIOVASCULAR: S1, S2, without any murmurs. Peripheral pulses 2+.   RESPIRATORY: Normal work of breathing. Clear to auscultation on both sides.  GASTROINTESTINAL: Soft abdomen, nontender. Bowel sounds present. No hepatosplenomegaly palpable.  GENITOURINARY: No CVA  tenderness or bladder distention.  SKIN: Warm and dry. No petechiae, rash, ulcers.  MUSCULOSKELETAL: No joint swelling, redness, effusion of the large joints. Normal muscle tone. Has a scar on her left knee.  NEUROLOGICAL: Motor strength five out of five in upper and lower extremities. Sensation is intact all over.  LYMPHATIC: No cervical lymphadenopathy.   LABORATORY STUDIES: Glucose of 193, BUN 14, creatinine 0.77, sodium 134, potassium 4.8, chloride 102. AST, ALT, bilirubin normal. CK of 243, CK-MB 1. Troponin less than 0.02. TSH of 151. WBC 6.9, hemoglobin 14.8, platelets of 92. D-dimer of 0.27. Urinalysis shows no bacteria. ABG shows pH of 7.33 with pCO2 of 50.   CT scan of the chest with contrast showed no evidence of pulmonary embolism.   CT scan of the head without contrast showed no acute intracranial process.   EKG shows normal sinus rhythm. No acute ST-wave changes.   ASSESSMENT AND PLAN:  1.  Syncope in a patient with dehydration and low blood pressure. The patient likely had syncope secondary to orthostatic hypotension. Her blood pressure is improved after IV fluids and presently is off the dopamine drip. We will monitor on a tele floor, get 2 more sets of cardiac enzymes. I suspect her symptoms of feeling weak for a week are secondary to hypotension and dehydration. She was started on Invokana 2 weeks back. This could be causing increased diuresis and fall in her blood pressure with dehydration. We will hold this medication. Continue IV fluids and check an echo, 2 more cardiac enzymes. Admit the patient under observation. Also, the patient is on narcotics, benzodiazepines, a new muscle relaxant, which are contributing to her low blood pressure.  2.  Diabetes mellitus. Continue Byetta and other medications except the Invokana. Put her on a sliding scale insulin.  3.  Chronic thrombocytopenia, stable. No heparin.  4.  Mild respiratory acidosis likely secondary from narcotics and  decreased respiratory drive. The patient did have a mild response to the Narcan.  5.  Deep venous thrombosis prophylaxis. Sequential compression devices.   CODE STATUS: FULL CODE.   TIME SPENT ON THIS CASE: 45 minutes.    ____________________________ Molinda BailiffSrikar R. Aadith Raudenbush, MD srs:np D: 08/28/2013 16:35:57 ET T: 08/28/2013 17:03:12 ET JOB#: [id number]  cc: Wardell HeathSrikar R. Shahzain Kiester, MD, <Dictator> Duane LopeJeffrey D. Judithann SheenSparks, MD Orie FishermanSRIKAR R Azarian Starace MD ELECTRONICALLY SIGNED 08/29/2013 17:25

## 2015-03-20 NOTE — Consult Note (Signed)
Brief Consult Note: Diagnosis: opiate abuse.   Consult note dictated.   Discussed with Attending MD.   Comments: Psychiatry: PAtient seen, chart reviewed, note dictated. Patient denies any suicidal ideation and is calm and lucid. Agrees to outpt SA treatment,. Can be discharged home to outpt followup. Will give 1 day worth of ativan to cover until she calls Dr Judithann SheenSparks.  Electronic Signatures: Adrianah Prophete, Jackquline DenmarkJohn T (MD)  (Signed 24-Sep-15 18:48)  Authored: Brief Consult Note   Last Updated: 24-Sep-15 18:48 by Audery Amellapacs, Makailyn Mccormick T (MD)
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
## 2015-03-24 ENCOUNTER — Telehealth: Payer: Self-pay | Admitting: *Deleted

## 2015-03-24 ENCOUNTER — Other Ambulatory Visit: Payer: Self-pay | Admitting: *Deleted

## 2015-03-24 DIAGNOSIS — E119 Type 2 diabetes mellitus without complications: Secondary | ICD-10-CM

## 2015-03-24 NOTE — Telephone Encounter (Signed)
Pt is coming tomorrow what labs and dx?  

## 2015-03-24 NOTE — Telephone Encounter (Signed)
Did you want any labs for Dawn Dean? I don't see anything that I need.

## 2015-03-24 NOTE — Telephone Encounter (Signed)
CMP for Diabetes diagnosis please. thanks

## 2015-03-25 ENCOUNTER — Other Ambulatory Visit: Payer: BLUE CROSS/BLUE SHIELD

## 2015-03-31 ENCOUNTER — Other Ambulatory Visit (INDEPENDENT_AMBULATORY_CARE_PROVIDER_SITE_OTHER): Payer: BLUE CROSS/BLUE SHIELD

## 2015-03-31 DIAGNOSIS — E119 Type 2 diabetes mellitus without complications: Secondary | ICD-10-CM

## 2015-03-31 LAB — COMPREHENSIVE METABOLIC PANEL
ALT: 26 U/L (ref 0–35)
AST: 25 U/L (ref 0–37)
Albumin: 4.2 g/dL (ref 3.5–5.2)
Alkaline Phosphatase: 188 U/L — ABNORMAL HIGH (ref 39–117)
BILIRUBIN TOTAL: 0.3 mg/dL (ref 0.2–1.2)
BUN: 17 mg/dL (ref 6–23)
CO2: 27 mEq/L (ref 19–32)
CREATININE: 0.92 mg/dL (ref 0.40–1.20)
Calcium: 9.6 mg/dL (ref 8.4–10.5)
Chloride: 99 mEq/L (ref 96–112)
GFR: 67.17 mL/min (ref >60.00)
GLUCOSE: 370 mg/dL — AB (ref 70–99)
Potassium: 4.6 mEq/L (ref 3.5–5.1)
Sodium: 133 mEq/L — ABNORMAL LOW (ref 135–145)
TOTAL PROTEIN: 6.9 g/dL (ref 6.0–8.3)

## 2015-04-01 ENCOUNTER — Encounter: Payer: Self-pay | Admitting: Endocrinology

## 2015-04-01 ENCOUNTER — Other Ambulatory Visit: Payer: Self-pay | Admitting: Nurse Practitioner

## 2015-04-01 ENCOUNTER — Ambulatory Visit (INDEPENDENT_AMBULATORY_CARE_PROVIDER_SITE_OTHER): Payer: BLUE CROSS/BLUE SHIELD | Admitting: Endocrinology

## 2015-04-01 VITALS — BP 104/68 | HR 79 | Resp 14 | Ht 65.0 in | Wt 243.8 lb

## 2015-04-01 DIAGNOSIS — E669 Obesity, unspecified: Secondary | ICD-10-CM

## 2015-04-01 DIAGNOSIS — E039 Hypothyroidism, unspecified: Secondary | ICD-10-CM

## 2015-04-01 DIAGNOSIS — R42 Dizziness and giddiness: Secondary | ICD-10-CM

## 2015-04-01 DIAGNOSIS — E119 Type 2 diabetes mellitus without complications: Secondary | ICD-10-CM

## 2015-04-01 MED ORDER — INSULIN ASPART 100 UNIT/ML FLEXPEN: PEN_INJECTOR | SUBCUTANEOUS | Status: DC

## 2015-04-01 NOTE — Assessment & Plan Note (Signed)
She will resume working on her diet and exercise. She will work on eating small meals and not skipping any meals.  Cut back on sodas, even diet.

## 2015-04-01 NOTE — Assessment & Plan Note (Signed)
Appears clinically euthyroid today. She will continue current dose of levothyroxine for now. Update TSH, free T4 and adjust dose based on labs. Will add on thyroid bloodwork to labs drawn yday.

## 2015-04-01 NOTE — Patient Instructions (Addendum)
Change lantus to 20 units daily.  Start novlog prior to each meal and give per scale below.  Increase hydration with water.  Cut back on diet pepsi and eat regular meals.  Will add on thyroid labs to yday's blood work.   Blood Glucose (mg/dL)  Breakfast  (Units Novolog Insulin)  Lunch  (Units Novolog Insulin)  Supper  (Units Novolog Insulin)  Nighttime (Units Novolog Insulin)   <70 Treat the low blood sugar.  Recheck blood glucose  in 15 mins. If sugar is more than 70, then take the number of units of insulin in the 70-90 row, if before a meal.   70-90   4   4   4   0  91-130 (Base Dose)  6  6  6   0   131-150  7  7  7   0   151-200  8  8  8   0   201-250  9  9  9  0   251-300  10  10  10 1    301-350  11  11  11 2    351-400  12  12  12  3    401-450  13  13  13 4    >450  14  14  14  5    Lantus insulin 20 units subcutaneous Nightly    Call me next week if sugars are staying high. Please come back for a follow-up appointment in 1 month.

## 2015-04-01 NOTE — Telephone Encounter (Signed)
Last OV and refill 1.12.16.  Next OV 5.12.16.  Please advise refill

## 2015-04-01 NOTE — Progress Notes (Signed)
Pre visit review using our clinic review tool, if applicable. No additional management support is needed unless otherwise documented below in the visit note. 

## 2015-04-01 NOTE — Assessment & Plan Note (Signed)
Last A1c not controlled. Recent sugars are not at target and quite elevated- this coupled with decreased eating, slight dehydration are probably causing her symptoms of dizziness.  Asked her to continue checking sugars 4 x daily.  Cut out all diet sodas and eat three times daily. Increase hydration with water. Add on meal time insulin to bring down the sugars- in spite of these if her symptoms continue- then could be secondary to other etiologies. Add on thyroid labs to labs from yday. Schedule appt with PCP for next week.   Continue current Metformin.  Decrease lantus to 20 units daily.  Start Novolog at [employer]6+1 scale qac. For tonight, she will take 4 units of Novolog if FS >350.  From tomorrow, take Novolog per scale. This was written down for her on her instruction sheet  She is aware to call me for further adjustments next week, if her sugars stay elevated.  Given insulin teaching on rapid acting insulin

## 2015-04-01 NOTE — Progress Notes (Signed)
Reason for visit-  Dawn Dean is a 54 y.o.-year-old female,  For f/u management of Type 2 diabetes, uncontrolled, without complications ( ?developing Stage 2 CKD).  Last visit March 2016.  *Previously seen endocrine, Dr Renae Fickle, at [facility] -dismissed from [place] practice due to substance abuse issues.    HPI- Patient has been diagnosed with diabetes in ~1995. Recalls being initially on lifestyle modifications.  Tried  Metformin, Invokana, Bydureon, Lantus , Glipizide/Glimeperide/Glyburide, Actos/Avandia, Bydureon/Tanzeum in the past.   * Didn't tolerate Tanzeum due to GI side effects ( nausea, gastritis). *taken off Lantus and Bydureon by [facility] due to lows about Sept 2015- Invokana dose reduced from 300 mg to 100 mg around that time *100+ days clean, now dealing with stress, anxiety and replaces emotions with food * Invokana 100 mg daily taken off due to insurance jan 2016>>switched to [place] * taken off Farxiga as GFR on the lower side at 57 in March 2016 and started on Lantus.   Pt is currently on a regimen of: - Metformin 1000 mg po bid -  Lantus 20 units daily ( start March 2016)>>32 units qhs, titrated up per my instructions    Last hemoglobin A1c was: by report 7.1% Oct 2015 -  Lab Results  Component Value Date   HGBA1C 10.2* 01/26/2015   HGBA1C 7.5* 09/28/2014   HGBA1C 8.5* 05/19/2014     Pt checks her sugars 1x a day .  Uses  One Touch mini glucometer.  By download they are:  PREMEAL Breakfast Lunch Dinner Bedtime Overall  Glucose range: 300-375 230-435  233-338   Mean/median:        POST-MEAL PC Breakfast PC Lunch PC Dinner  Glucose range:     Mean/median:       Hypoglycemia-  No recent lows. she has hypoglycemia awareness at 54.   Dietary habits- usually eats three times daily- was not eating BF, stress et last visit  . Tries to limit carbs, sweetened beverages, sodas, desserts. Drinks diet sodas, unsweet tea, occasional chocolate> since last  visit, has decreased appetite with some nausea, skipping meals frequently Exercise- walks 20 min daily of TD o/w walks her dogs for 30 minutes>>started back on this after initial halt Weight -  Gone up  Wt Readings from Last 3 Encounters:  04/01/15 243 lb 12 oz (110.564 kg)  01/26/15 229 lb (103.874 kg)  01/07/15 227 lb 1.9 oz (103.021 kg)    Diabetes Complications-  Nephropathy- No  CKD, last BUN/creatinine- GFR 48 in the past Lab Results  Component Value Date   BUN 17 03/31/2015   CREATININE 0.92 03/31/2015   Lab Results  Component Value Date   GFR 67.17 03/31/2015    Lab Results  Component Value Date   MICRALBCREAT 1.2 01/26/2015      Retinopathy- No, Last DEE was this year~Feb 2016 Neuropathy- no numbness and tingling in her feet. No known neuropathy. Taking neurontin for her "mood" per patient. Associated history - No CAD . No prior stroke.   Hypothyroidism- Has hypothyroidism that was diagnosed about 2005. Last dose change of LT4 ~1 year ago. Currently taking levothyroxine, dose of 100 mcg daily. Taking it separate from food, and her pills and MVI.   her last TSH was  Lab Results  Component Value Date   TSH 2.29 01/26/2015   Review of systems: [ x  ] complains of    [  ] denies [ x ] weight gain-diet related  [  ] constipation  [  ]  fatigue  [  ] dry skin  [  ] cold intolerance [  ] hair changes [  ] noticing any enlargement in size of thyroid [  ] lumps in neck [  ] dysphagia   Hyperlipidemia-  her last set of lipids were- Currently on Lipitor 10mg  daily. Tolerating well.   Lab Results  Component Value Date   CHOL 194 01/26/2015   HDL 39.70 01/26/2015   LDLCALC 89 05/19/2014   LDLDIRECT 100.0 01/26/2015   TRIG 348.0* 01/26/2015   CHOLHDL 5 01/26/2015    Blood Pressure/HTN- Patient's blood pressure is well controlled today on current regimen that includes ACE-I ( Lisinopril).  Reporting dizziness for the past 4 weeks, worse when bending down. Not eating  enough, due to decreased appetite, drinking a lot of diet pepsi ( now trying to wean down and taking in about 5 cans/day). Feels like room spinning. Denies any substance abuse. Not drinking enough water during the day. FS are high.  No ear pain or discharge, or HAs, fever.  No chest pain, some discomfort occasionally , not today. From increased gases along with increased constipation  I have reviewed the patient's past medical history,  medications and allergies.   Current Outpatient Prescriptions on File Prior to Visit  Medication Sig Dispense Refill  . atorvastatin (LIPITOR) 10 MG tablet Take 1 tablet (10 mg total) by mouth at bedtime. 90 tablet 2  . citalopram (CELEXA) 20 MG tablet Take 2 tablets (40 mg total) by mouth at bedtime. Take 40 mg by mouth daily at bedtime 180 tablet 2  . docusate sodium (COLACE) 100 MG capsule Take 1 capsule (100 mg total) by mouth 3 (three) times daily as needed for constipation. 30 capsule 0  . esomeprazole (NEXIUM) 40 MG capsule Take 1 capsule (40 mg total) by mouth 2 (two) times daily with a meal. 180 capsule 2  . fluticasone (FLONASE) 50 MCG/ACT nasal spray Place 2 sprays into both nostrils daily. 16 g 6  . gabapentin (NEURONTIN) 400 MG capsule TAKE 1 CAPSULE (400 MG TOTAL) BY MOUTH 3 (THREE) TIMES DAILY. 270 capsule 1  . glucose blood (ONE TOUCH ULTRA TEST) test strip Test sugars 2x daily 100 each 12  . ibuprofen (ADVIL,MOTRIN) 200 MG tablet Take 3 tablets (600 mg total) by mouth every 6 (six) hours as needed. 180 tablet 2  . Insulin Glargine (LANTUS SOLOSTAR) 100 UNIT/ML Solostar Pen Inject 20 Units into the skin daily at 10 pm. Titrate up to max dose of 32 units daily as instructed. (Patient taking differently: Inject 32 Units into the skin daily at 10 pm. Titrate up to max dose of 32 units daily as instructed.) 5 pen 3  . Insulin Pen Needle (BD PEN NEEDLE NANO U/F) 32G X 4 MM MISC Use once daily for insulin admin 30 each 4  . levothyroxine (SYNTHROID,  LEVOTHROID) 100 MCG tablet Take 1 tablet (100 mcg total) by mouth daily. 30 tablet 3  . lisinopril (PRINIVIL,ZESTRIL) 10 MG tablet Take 1 tablet (10 mg total) by mouth daily. 90 tablet 2  . metFORMIN (GLUCOPHAGE) 1000 MG tablet Take 1 tablet (1,000 mg total) by mouth 2 (two) times daily with a meal. 180 tablet 2  . methocarbamol (ROBAXIN) 500 MG tablet Take 1 tablet (500 mg total) by mouth 3 (three) times daily. (Patient taking differently: Take 500 mg by mouth 3 (three) times daily. Pt taking two pills 3 x a day) 90 tablet 0  . Multiple Vitamins-Minerals (CENTRUM SILVER ADULT  50+ PO) Take 1 tablet by mouth daily.    Marland Kitchen. nystatin cream (MYCOSTATIN) Apply 1 application topically 2 (two) times daily. 30 g 0  . Omega-3 Fatty Acids (FISH OIL) 300 MG CAPS Take 3 capsules by mouth daily.    . ondansetron (ZOFRAN) 4 MG tablet Take 1 tablet (4 mg total) by mouth every 8 (eight) hours as needed for nausea. 20 tablet 0  . ONETOUCH DELICA LANCETS 33G MISC Test sugars 2x daily 100 each 11  . QUEtiapine (SEROQUEL) 100 MG tablet Take 1 tablet (100 mg total) by mouth at bedtime. 90 tablet 2  . traZODone (DESYREL) 100 MG tablet Take 1 tablet (100 mg total) by mouth at bedtime. 90 tablet 2  . traZODone (DESYREL) 50 MG tablet Take 0.5-1 tablets (25-50 mg total) by mouth at bedtime as needed for sleep. 30 tablet 3   No current facility-administered medications on file prior to visit.   No Known Allergies   Review of Systems- [ x ]  Complains of    [  ]  denies [x  ] Recent weight change [x  ]  Fatigue [  ] polydipsia [ x ] polyuria [ ]   nocturia [  ]  vision difficulty [ x ] chest pain-not today [  ] shortness of breath [  ] leg swelling [  ] cough [x  ] nausea/vomiting [  ] diarrhea [ x ] constipation [  ] abdominal pain [  ]  tingling/numbness in extremities [  ]  concern with feet ( wounds/sores)   PE: BP 104/68 mmHg  Pulse 79  Resp 14  Ht 5\' 5"  (1.651 m)  Wt 243 lb 12 oz (110.564 kg)  BMI 40.56 kg/m2   SpO2 96% Wt Readings from Last 3 Encounters:  04/01/15 243 lb 12 oz (110.564 kg)  01/26/15 229 lb (103.874 kg)  01/07/15 227 lb 1.9 oz (103.021 kg)   Negative Orthostatics HEENT: [place]/AT, EOMI, no icterus, no proptosis, no chemosis, no mild lid lag, no retraction, eyes close completely, B/L ear canals clean, dry tongue Lungs: good air entry, clear bilaterally Heart: S1&S2 normal, regular rate & rhythm; no murmurs, rubs or gallops Ext: no tremor in hands bilaterally, no edema, 2+ DP/PT pulses, good muscle mass Neuro: normal gait, 2+ reflexes bilaterally, normal 5/5 strength, no proximal myopathy  Derm: no pretibial myxoedema/skin dryness   ASSESSMENT AND PLAN: Problem List Items Addressed This Visit      Endocrine   Diabetes mellitus type II, controlled - Primary    Last A1c not controlled. Recent sugars are not at target and quite elevated- this coupled with decreased eating, slight dehydration are probably causing her symptoms of dizziness.  Asked her to continue checking sugars 4 x daily.  Cut out all diet sodas and eat three times daily. Increase hydration with water. Add on meal time insulin to bring down the sugars- in spite of these if her symptoms continue- then could be secondary to other etiologies. Add on thyroid labs to labs from yday. Schedule appt with PCP for next week.   Continue current Metformin.  Decrease lantus to 20 units daily.  Start Novolog at [employer]6+1 scale qac. For tonight, she will take 4 units of Novolog if FS >350.  From tomorrow, take Novolog per scale. This was written down for her on her instruction sheet  She is aware to call me for further adjustments next week, if her sugars stay elevated.  Given insulin teaching on rapid acting insulin  Relevant Medications   insulin aspart (NOVOLOG FLEXPEN) 100 UNIT/ML FlexPen   Hypothyroidism    Appears clinically euthyroid today. She will continue current dose of levothyroxine for now. Update TSH, free T4  and adjust dose based on labs. Will add on thyroid bloodwork to labs drawn yday.              Other   Obesity (BMI 30-39.9)    She will resume working on her diet and exercise. She will work on eating small meals and not skipping any meals.  Cut back on sodas, even diet.            Relevant Medications   insulin aspart (NOVOLOG FLEXPEN) 100 UNIT/ML FlexPen        - Return to clinic in 1 mo with sugar log/meter.  Dawn Dean [facility] 04/01/2015 3:19 PM

## 2015-04-02 ENCOUNTER — Other Ambulatory Visit (INDEPENDENT_AMBULATORY_CARE_PROVIDER_SITE_OTHER): Payer: BLUE CROSS/BLUE SHIELD

## 2015-04-02 DIAGNOSIS — E039 Hypothyroidism, unspecified: Secondary | ICD-10-CM

## 2015-04-02 LAB — TSH: TSH: 2.2 u[IU]/mL (ref 0.35–4.50)

## 2015-04-02 LAB — T4, FREE: Free T4: 0.76 ng/dL (ref 0.60–1.60)

## 2015-04-08 ENCOUNTER — Encounter: Payer: Self-pay | Admitting: Nurse Practitioner

## 2015-04-08 ENCOUNTER — Ambulatory Visit (INDEPENDENT_AMBULATORY_CARE_PROVIDER_SITE_OTHER): Payer: BLUE CROSS/BLUE SHIELD | Admitting: Nurse Practitioner

## 2015-04-08 VITALS — BP 120/70 | HR 74 | Temp 97.6°F | Resp 12 | Ht 65.0 in | Wt 244.0 lb

## 2015-04-08 DIAGNOSIS — Z1211 Encounter for screening for malignant neoplasm of colon: Secondary | ICD-10-CM | POA: Diagnosis not present

## 2015-04-08 DIAGNOSIS — E119 Type 2 diabetes mellitus without complications: Secondary | ICD-10-CM | POA: Diagnosis not present

## 2015-04-08 DIAGNOSIS — M6248 Contracture of muscle, other site: Secondary | ICD-10-CM

## 2015-04-08 DIAGNOSIS — Z87898 Personal history of other specified conditions: Secondary | ICD-10-CM

## 2015-04-08 DIAGNOSIS — N898 Other specified noninflammatory disorders of vagina: Secondary | ICD-10-CM | POA: Diagnosis not present

## 2015-04-08 DIAGNOSIS — M62838 Other muscle spasm: Secondary | ICD-10-CM

## 2015-04-08 DIAGNOSIS — F1911 Other psychoactive substance abuse, in remission: Secondary | ICD-10-CM

## 2015-04-08 MED ORDER — METHOCARBAMOL 500 MG PO TABS: ORAL_TABLET | ORAL | Status: DC

## 2015-04-08 MED ORDER — ONDANSETRON HCL 4 MG PO TABS: 4.0000 mg | ORAL_TABLET | ORAL | Status: DC | PRN

## 2015-04-08 NOTE — Patient Instructions (Addendum)
Note from Dr. Welford RochePhadke:  (Blood sugars- over 200) 24 units of Lantus and continue same regimen for Novolog   Hormonal creams will worsen the situation. So wait until cleared before we try this.   Robaxin 2 tablets 3 x a day for 90 days sent to pharmacy. Pick up when needed.   Healthy diet and exercise.

## 2015-04-08 NOTE — Progress Notes (Signed)
Subjective:    Patient ID: Barbaraann RondoKimberly P Paxson, female    DOB: 1961-03-20, 54 y.o.   MRN: [id number]  HPI  [name] is a 54 yo female with CC of nausea, dizziness, and medication management.   1) Some nausea with insulin. Will speak with Dr. Welford RochePhadke about this.  BS Running 200's   2) Ky not helping dryness in vaginal area.   3) Requesting refill for zofran and to change directions on Robaxin.   5) Some light headedness (moreso than dizziness), intermittent, happens with small meals and moving positions. Reports not drinking enough water.   Review of Systems  Constitutional: Negative for fever, chills, diaphoresis and fatigue.  Respiratory: Negative for chest tightness, shortness of breath and wheezing.   Cardiovascular: Negative for chest pain, palpitations and leg swelling.  Gastrointestinal: Positive for nausea. Negative for vomiting, diarrhea and rectal pain.  Genitourinary: Positive for vaginal pain. Negative for vaginal bleeding and vaginal discharge.       Painful and uncomfortable with vaginal dryness  Skin: Negative for rash.  Neurological: Positive for light-headedness. Negative for dizziness, weakness and headaches.  Psychiatric/Behavioral: The patient is not nervous/anxious.    Past Medical History  Diagnosis Date  . Hypothyroidism   . Anxiety   . Mental disorder   . Diabetes mellitus without complication     diagnosed -20 yrs. ago   . GERD (gastroesophageal reflux disease)   . Headache(784.0)     History   Social History  . Marital Status: Married    Spouse Name: [name]  . Number of Children: [name]  . Years of Education: [name]   Occupational History  . Not on file.   Social History Main Topics  . Smoking status: Former Smoker    Quit date: 12/05/1993  . Smokeless tobacco: Never Used  . Alcohol Use: No     Comment: very seldom   . Drug Use: No  . Sexual Activity:    Partners: Male   Other Topics Concern  . Not on file   Social History Narrative   Married 30 years to husband   [place]   1 son 56 yo   2 dogs- taz and oreo   1 cat- pumpkin   Garment/textile technologistAssociate RN    Currently not working, will start looking for work early 2016    - On the Conservation [employer], [employer]        Past Surgical History  Procedure Laterality Date  . Joint replacement      L knee  . Knee arthroscopy      L knee  . Sinus surgery      for deviated septum  . Cesarean section      1985  . Dilation and curettage of uterus    . Cervical discectomy  12/05/2012  . Anterior cervical decomp/discectomy fusion  12/05/2012    Procedure: ANTERIOR CERVICAL DECOMPRESSION/DISCECTOMY FUSION 1 LEVEL;  Surgeon: Venita Lickahari Brooks, MD;  Location: [facility] OR;  Service: Orthopedics;  Laterality: [name];  ACDF C5-6    Family History  Problem Relation Age of Onset  . Diabetes Cousin   . Heart disease Other     No Known Allergies  Current Outpatient Prescriptions on File Prior to Visit  Medication Sig Dispense Refill  . atorvastatin (LIPITOR) 10 MG tablet Take 1 tablet (10 mg total) by mouth at bedtime. 90 tablet 2  . citalopram (CELEXA) 20 MG tablet Take 2 tablets (40 mg total) by mouth at  bedtime. Take 40 mg by mouth daily at bedtime 180 tablet 2  . docusate sodium (COLACE) 100 MG capsule Take 1 capsule (100 mg total) by mouth 3 (three) times daily as needed for constipation. 30 capsule 0  . esomeprazole (NEXIUM) 40 MG capsule Take 1 capsule (40 mg total) by mouth 2 (two) times daily with a meal. 180 capsule 2  . fluticasone (FLONASE) 50 MCG/ACT nasal spray Place 2 sprays into both nostrils daily. 16 g 6  . gabapentin (NEURONTIN) 400 MG capsule TAKE 1 CAPSULE (400 MG TOTAL) BY MOUTH 3 (THREE) TIMES DAILY. 270 capsule 1  . ibuprofen (ADVIL,MOTRIN) 200 MG tablet Take 3 tablets (600 mg total) by mouth every 6 (six) hours as needed. 180 tablet 2  . insulin aspart (NOVOLOG FLEXPEN) 100 UNIT/ML FlexPen Inject [place] three times daily prior to meals Based on  sliding scale between 6-16 units MDD, 60 units 15 mL 3  . Insulin Glargine (LANTUS SOLOSTAR) 100 UNIT/ML Solostar Pen Inject 20 Units into the skin daily at 10 pm. Titrate up to max dose of 32 units daily as instructed. (Patient taking differently: Inject 32 Units into the skin daily at 10 pm. Titrate up to max dose of 32 units daily as instructed.) 5 pen 3  . Insulin Pen Needle (BD PEN NEEDLE NANO U/F) 32G X 4 MM MISC Use once daily for insulin admin 30 each 4  . levothyroxine (SYNTHROID, LEVOTHROID) 100 MCG tablet Take 1 tablet (100 mcg total) by mouth daily. 30 tablet 3  . lisinopril (PRINIVIL,ZESTRIL) 10 MG tablet Take 1 tablet (10 mg total) by mouth daily. 90 tablet 2  . metFORMIN (GLUCOPHAGE) 1000 MG tablet Take 1 tablet (1,000 mg total) by mouth 2 (two) times daily with a meal. 180 tablet 2  . Multiple Vitamins-Minerals (CENTRUM SILVER ADULT 50+ PO) Take 1 tablet by mouth daily.    Marland Kitchen. nystatin cream (MYCOSTATIN) Apply 1 application topically 2 (two) times daily. 30 g 0  . Omega-3 Fatty Acids (FISH OIL) 300 MG CAPS Take 3 capsules by mouth daily.    Letta Pate. ONETOUCH DELICA LANCETS 33G MISC Test sugars 2x daily 100 each 11  . QUEtiapine (SEROQUEL) 100 MG tablet Take 1 tablet (100 mg total) by mouth at bedtime. 90 tablet 2  . traZODone (DESYREL) 100 MG tablet Take 1 tablet (100 mg total) by mouth at bedtime. 90 tablet 2  . traZODone (DESYREL) 50 MG tablet Take 0.5-1 tablets (25-50 mg total) by mouth at bedtime as needed for sleep. 30 tablet 3   No current facility-administered medications on file prior to visit.       Objective:   Physical Exam  Constitutional: She is oriented to person, place, and time. She appears well-developed and well-nourished. No distress.  BP 120/70 mmHg  Pulse 74  Temp(Src) 97.6 F (36.4 C) (Oral)  Resp 12  Ht 5\' 5"  (1.651 m)  Wt 244 lb (110.678 kg)  BMI 40.60 kg/m2  SpO2 96%   HENT:  Head: Normocephalic and atraumatic.  Right Ear: External ear normal.  Left  Ear: External ear normal.  Eyes: EOM are normal. Pupils are equal, round, and reactive to light. Right eye exhibits no discharge. Left eye exhibits no discharge. No scleral icterus.  Cardiovascular: Normal rate, regular rhythm and normal heart sounds.   Pulmonary/Chest: Effort normal and breath sounds normal. No respiratory distress. She has no wheezes. She has no rales. She exhibits no tenderness.  Genitourinary:  Deferred  Musculoskeletal: Normal range of motion.  She exhibits no edema or tenderness.  Neurological: She is alert and oriented to person, place, and time. No cranial nerve deficit. She exhibits normal muscle tone. Coordination normal.  Skin: Skin is warm and dry. No rash noted. She is not diaphoretic.  Psychiatric: She has a normal mood and affect. Her behavior is normal. Judgment and thought content normal.      Assessment & Plan:

## 2015-04-08 NOTE — Progress Notes (Signed)
Pre visit review using our clinic review tool, if applicable. No additional management support is needed unless otherwise documented below in the visit note. 

## 2015-04-19 ENCOUNTER — Other Ambulatory Visit: Payer: Self-pay | Admitting: *Deleted

## 2015-04-19 ENCOUNTER — Telehealth: Payer: Self-pay | Admitting: Nurse Practitioner

## 2015-04-19 MED ORDER — GLUCOSE BLOOD VI STRP: ORAL_STRIP | Status: DC

## 2015-04-19 NOTE — Telephone Encounter (Signed)
Pt called about being out of her Ultra onetouch strips due to a Rx change and needs a refill.

## 2015-04-19 NOTE — Telephone Encounter (Signed)
Spoke with pt, rx changed and sent

## 2015-04-25 DIAGNOSIS — M62838 Other muscle spasm: Secondary | ICD-10-CM | POA: Insufficient documentation

## 2015-04-25 DIAGNOSIS — N898 Other specified noninflammatory disorders of vagina: Secondary | ICD-10-CM | POA: Insufficient documentation

## 2015-04-25 NOTE — Assessment & Plan Note (Signed)
Discussed options. Stable. [place] and other lubricants not helping. Will not do any hormonal preparations at this time due to uncontrolled blood sugars.

## 2015-04-25 NOTE — Assessment & Plan Note (Addendum)
Instructions from Dr. Welford RochePhadke:  (Blood sugars- over 200) 24 units of Lantus and continue same regimen for Novolog   As far as lightheadedness and nausea- Healthy diet, exercise, and drinking water to stay hydrated.

## 2015-04-25 NOTE — Assessment & Plan Note (Signed)
She has completed requirements and is ready to look for work.

## 2015-04-25 NOTE — Assessment & Plan Note (Signed)
Taking robaxin. Stable. Will refill for 2 tablets by mouth 3 x daily. Will follow.

## 2015-05-03 ENCOUNTER — Telehealth: Payer: Self-pay

## 2015-05-03 NOTE — Telephone Encounter (Signed)
Order was placed at last visit, [facility]

## 2015-05-03 NOTE — Telephone Encounter (Signed)
The patient called and is hoping to get her colonoscopy scheduled.  Thanks!

## 2015-05-05 ENCOUNTER — Telehealth: Payer: Self-pay | Admitting: Gastroenterology

## 2015-05-05 NOTE — Telephone Encounter (Signed)
triage

## 2015-05-06 ENCOUNTER — Ambulatory Visit: Payer: BLUE CROSS/BLUE SHIELD | Admitting: Endocrinology

## 2015-05-11 ENCOUNTER — Encounter: Payer: Self-pay | Admitting: Endocrinology

## 2015-05-11 ENCOUNTER — Ambulatory Visit (INDEPENDENT_AMBULATORY_CARE_PROVIDER_SITE_OTHER): Payer: BLUE CROSS/BLUE SHIELD | Admitting: Endocrinology

## 2015-05-11 VITALS — BP 104/60 | HR 70 | Resp 12 | Ht 65.0 in | Wt 227.5 lb

## 2015-05-11 DIAGNOSIS — E669 Obesity, unspecified: Secondary | ICD-10-CM | POA: Diagnosis not present

## 2015-05-11 DIAGNOSIS — E785 Hyperlipidemia, unspecified: Secondary | ICD-10-CM

## 2015-05-11 DIAGNOSIS — E119 Type 2 diabetes mellitus without complications: Secondary | ICD-10-CM | POA: Diagnosis not present

## 2015-05-11 DIAGNOSIS — E039 Hypothyroidism, unspecified: Secondary | ICD-10-CM | POA: Diagnosis not present

## 2015-05-11 LAB — LDL CHOLESTEROL, DIRECT: Direct LDL: 88 mg/dL

## 2015-05-11 LAB — COMPREHENSIVE METABOLIC PANEL
ALBUMIN: 4.6 g/dL (ref 3.5–5.2)
ALK PHOS: 72 U/L (ref 39–117)
ALT: 22 U/L (ref 0–35)
AST: 29 U/L (ref 0–37)
BUN: 25 mg/dL — ABNORMAL HIGH (ref 6–23)
CHLORIDE: 106 meq/L (ref 96–112)
CO2: 24 mEq/L (ref 19–32)
Calcium: 9.3 mg/dL (ref 8.4–10.5)
Creatinine, Ser: 0.95 mg/dL (ref 0.40–1.20)
GFR: 64.7 mL/min (ref >60.00)
GLUCOSE: 80 mg/dL (ref 70–99)
POTASSIUM: 4.1 meq/L (ref 3.5–5.1)
Sodium: 137 mEq/L (ref 135–145)
Total Bilirubin: 0.3 mg/dL (ref 0.2–1.2)
Total Protein: 7.1 g/dL (ref 6.0–8.3)

## 2015-05-11 LAB — LIPID PANEL
CHOL/HDL RATIO: 5
Cholesterol: 169 mg/dL (ref 0–200)
HDL: 32.7 mg/dL — ABNORMAL LOW (ref >39.00)
NONHDL: 136.3
Triglycerides: 329 mg/dL — ABNORMAL HIGH (ref 0.0–149.0)
VLDL: 65.8 mg/dL — ABNORMAL HIGH (ref 0.0–40.0)

## 2015-05-11 LAB — HEMOGLOBIN A1C: HEMOGLOBIN A1C: 9.2 % — AB (ref 4.6–6.5)

## 2015-05-11 NOTE — Patient Instructions (Signed)
Labs to be done today.  Continue current insulin for now.  Report back if you start seeing lows, with further weight loss as well.  Please come back for a follow-up appointment in 6 weeks [place] location

## 2015-05-11 NOTE — Progress Notes (Signed)
Reason for visit-  Dawn Dean is a 54 y.o.-year-old female,  For f/u management of Type 2 diabetes, uncontrolled, without complications ( ?developing Stage 2 CKD).  Last visit May 2016.  *Previously seen endocrine, Dr Dawn Dean, at [facility] -dismissed from [place] practice due to substance abuse issues.    HPI- Patient has been diagnosed with diabetes in ~1995. Recalls being initially on lifestyle modifications.  Tried  Metformin, Invokana, Bydureon, Lantus , Glipizide/Glimeperide/Glyburide, Actos/Avandia, Bydureon/Tanzeum in the past.   * Didn't tolerate Tanzeum due to GI side effects ( nausea, gastritis). *taken off Lantus and Bydureon by [facility] due to lows about Sept 2015- Invokana dose reduced from 300 mg to 100 mg around that time *100+ days clean, now dealing with stress, anxiety and replaces emotions with food * Invokana 100 mg daily taken off due to insurance jan 2016>>switched to [place] * taken off Farxiga as GFR on the lower side at 57 in March 2016 and started on Lantus.   Pt is currently on a regimen of: - Metformin 1000 mg po bid -  Lantus 20 units daily ( start March 2016)>>self adjusted dose to 30 units  -Novolog 6+1 scale qac ( start may 2016)>>using two times daily. Needing 5-6 units  * Of note, she has also been started on Contrave few weeks ago by her therapist and this has helped with impulse control and eating  Last hemoglobin A1c was: by report 7.1% Oct 2015 -  Lab Results  Component Value Date   HGBA1C 9.2* 05/11/2015   HGBA1C 10.2* 01/26/2015   HGBA1C 7.5* 09/28/2014     Pt checks her sugars 2-3x a day .  Uses  One Touch mini glucometer.  By download they are:  PREMEAL Breakfast Lunch Dinner Bedtime Overall  Glucose range: 88-127 110-148 107-162 103-251   Mean/median:        POST-MEAL PC Breakfast PC Lunch PC Dinner  Glucose range:     Mean/median:       Hypoglycemia-  one recent lows-68 in the night, ?cause. she has hypoglycemia  awareness at 97.   Dietary habits- usually eats three times daily- was not eating BF, stress etc last visit  . Tries to limit carbs, sweetened beverages, sodas, desserts. Drinks diet sodas, unsweet tea, occasional chocolate> since last visit, has decreased appetite with Contrave, tolerating well Exercise- walks 4 x daily about 1 mile Weight -  Gone down  Wt Readings from Last 3 Encounters:  05/11/15 227 lb 8 oz (103.193 kg)  04/08/15 244 lb (110.678 kg)  04/01/15 243 lb 12 oz (110.564 kg)    Diabetes Complications-  Nephropathy- No  CKD, last BUN/creatinine- GFR 48 in the past Lab Results  Component Value Date   BUN 25* 05/11/2015   CREATININE 0.95 05/11/2015   Lab Results  Component Value Date   GFR 64.70 05/11/2015    Lab Results  Component Value Date   MICRALBCREAT 1.2 01/26/2015      Retinopathy- No, Last DEE was this year~Feb 2015 Neuropathy- no numbness and tingling in her feet. No known neuropathy. Taking neurontin for her "mood" per patient. Associated history - No CAD . No prior stroke.   Hypothyroidism- Has hypothyroidism that was diagnosed about 2005. Last dose change of LT4 ~1 year ago. Currently taking levothyroxine, dose of 100 mcg daily. Taking it separate from food, and her pills and MVI.   her last TSH was  Lab Results  Component Value Date   TSH 2.20 04/02/2015   Lab  Results  Component Value Date   FREET4 0.76 04/02/2015   FREET4 0.88 01/26/2015    Review of systems: [ x  ] complains of    [  ] denies [ x ] weight gain-diet related- now down  [  ] constipation  [  ] fatigue  [  ] dry skin  [  ] cold intolerance [  ] hair changes [  ] noticing any enlargement in size of thyroid [  ] lumps in neck [  ] dysphagia   Hyperlipidemia-  her last set of lipids were- Currently on Lipitor 10mg  daily. Tolerating well.   Lab Results  Component Value Date   CHOL 169 05/11/2015   HDL 32.70* 05/11/2015   LDLCALC 89 05/19/2014   LDLDIRECT 88.0 05/11/2015    TRIG 329.0* 05/11/2015   CHOLHDL 5 05/11/2015    Blood Pressure/HTN- Patient's blood pressure is well controlled today on current regimen that includes ACE-I ( Lisinopril).   I have reviewed the patient's past medical history,  medications and allergies.   Current Outpatient Prescriptions on File Prior to Visit  Medication Sig Dispense Refill  . atorvastatin (LIPITOR) 10 MG tablet Take 1 tablet (10 mg total) by mouth at bedtime. 90 tablet 2  . citalopram (CELEXA) 20 MG tablet Take 2 tablets (40 mg total) by mouth at bedtime. Take 40 mg by mouth daily at bedtime 180 tablet 2  . docusate sodium (COLACE) 100 MG capsule Take 1 capsule (100 mg total) by mouth 3 (three) times daily as needed for constipation. 30 capsule 0  . esomeprazole (NEXIUM) 40 MG capsule Take 1 capsule (40 mg total) by mouth 2 (two) times daily with a meal. 180 capsule 2  . fluticasone (FLONASE) 50 MCG/ACT nasal spray Place 2 sprays into both nostrils daily. 16 g 6  . gabapentin (NEURONTIN) 400 MG capsule TAKE 1 CAPSULE (400 MG TOTAL) BY MOUTH 3 (THREE) TIMES DAILY. 270 capsule 1  . glucose blood (ONE TOUCH ULTRA TEST) test strip Test sugars 4x daily 150 each 12  . ibuprofen (ADVIL,MOTRIN) 200 MG tablet Take 3 tablets (600 mg total) by mouth every 6 (six) hours as needed. 180 tablet 2  . insulin aspart (NOVOLOG FLEXPEN) 100 UNIT/ML FlexPen Inject Dawn Dean three times daily prior to meals Based on sliding scale between 6-16 units MDD, 60 units 15 mL 3  . Insulin Glargine (LANTUS SOLOSTAR) 100 UNIT/ML Solostar Pen Inject 20 Units into the skin daily at 10 pm. Titrate up to max dose of 32 units daily as instructed. (Patient taking differently: Inject 30 Units into the skin daily at 10 pm. Titrate up to max dose of 32 units daily as instructed.) 5 pen 3  . Insulin Pen Needle (BD PEN NEEDLE NANO U/F) 32G X 4 MM MISC Use once daily for insulin admin 30 each 4  . levothyroxine (SYNTHROID, LEVOTHROID) 100 MCG tablet Take 1 tablet (100 mcg  total) by mouth daily. 30 tablet 3  . lisinopril (PRINIVIL,ZESTRIL) 10 MG tablet Take 1 tablet (10 mg total) by mouth daily. 90 tablet 2  . metFORMIN (GLUCOPHAGE) 1000 MG tablet Take 1 tablet (1,000 mg total) by mouth 2 (two) times daily with a meal. 180 tablet 2  . methocarbamol (ROBAXIN) 500 MG tablet Take 2 tablets by mouth three times a day. 540 tablet 1  . Multiple Vitamins-Minerals (CENTRUM SILVER ADULT 50+ PO) Take 1 tablet by mouth daily.    . Omega-3 Fatty Acids (FISH OIL) 300 MG CAPS Take 3  capsules by mouth daily.    . ondansetron (ZOFRAN) 4 MG tablet Take 1 tablet (4 mg total) by mouth as needed for nausea. 20 tablet 0  . ONETOUCH DELICA LANCETS 33G MISC Test sugars 2x daily 100 each 11  . QUEtiapine (SEROQUEL) 100 MG tablet Take 1 tablet (100 mg total) by mouth at bedtime. 90 tablet 2  . traZODone (DESYREL) 100 MG tablet Take 1 tablet (100 mg total) by mouth at bedtime. 90 tablet 2  . traZODone (DESYREL) 50 MG tablet Take 0.5-1 tablets (25-50 mg total) by mouth at bedtime as needed for sleep. 30 tablet 3   No current facility-administered medications on file prior to visit.   No Known Allergies  Review of Systems- [ x ]  Complains of    [  ]  denies [  ] Recent weight change [  ]  Fatigue [  ] polydipsia [  ] polyuria [  ]  nocturia [  ]  vision difficulty [  ] chest pain [  ] shortness of breath [  ] leg swelling [  ] cough [  ] nausea/vomiting [  ] diarrhea [  ] constipation [  ] abdominal pain [  ]  tingling/numbness in extremities [  ]  concern with feet ( wounds/sores)   PE: BP 104/60 mmHg  Pulse 70  Resp 12  Ht  (1.651 m)  Wt 227 lb 8 oz (103.193 kg)  BMI 37.86 kg/m2  SpO2 95% Wt Readings from Last 3 Encounters:  05/11/15 227 lb 8 oz (103.193 kg)  04/08/15 244 lb (110.678 kg)  04/01/15 243 lb 12 oz (110.564 kg)   Exam: deferred   ASSESSMENT AND PLAN: Problem List Items Addressed This Visit      Endocrine   Diabetes mellitus type II, controlled -  Primary    Last A1c not controlled. Recent sugars have improved after recent start of Contrave and Novolog.  Continue current insulins for now, asked her not to self adjust medications. Asked her to continue checking sugars 4 x daily.  If starts to see lows, specially with further weight loss, then she was asked to call us for further insulin adjustments.  Increase hydration with water. Continue current Metformin.             Relevant Orders   Comprehensive metabolic panel (Completed)   Hemoglobin A1c (Completed)   Lipid panel (Completed)   Hypothyroidism    Appears clinically euthyroid today. She will continue current dose of levothyroxine for now. Last levels were at target.           Relevant Orders   Comprehensive metabolic panel (Completed)   Hemoglobin A1c (Completed)   Lipid panel (Completed)     Other   Obesity (BMI 30-39.9)    She will continue working on her diet and exercise. She will work on eating small meals and not skipping any meals.  Continue current contrave per her therapist.              Relevant Medications   CONTRAVE 8-90 MG TB12   Other Relevant Orders   Comprehensive metabolic panel (Completed)   Hemoglobin A1c (Completed)   Lipid panel (Completed)   Hyperlipidemia    Currently on statin.  Expect improvement in TGs with better sugars LDL at target on current statin        Relevant Orders   Lipid panel (Completed)        - Return to clinic in 6  weeks with sugar log/meter. Labs today. Explained that [place] am [place] of [place] , and she has elected to follow up with my colleagues at [employer].   [facility] 05/17/2015 9:21 AM

## 2015-05-11 NOTE — Progress Notes (Signed)
Pre visit review using our clinic review tool, if applicable. No additional management support is needed unless otherwise documented below in the visit note. 

## 2015-05-12 ENCOUNTER — Encounter: Payer: Self-pay | Admitting: *Deleted

## 2015-05-13 ENCOUNTER — Encounter: Payer: BLUE CROSS/BLUE SHIELD | Admitting: Urgent Care

## 2015-05-13 ENCOUNTER — Encounter (INDEPENDENT_AMBULATORY_CARE_PROVIDER_SITE_OTHER): Payer: Self-pay

## 2015-05-13 ENCOUNTER — Other Ambulatory Visit: Payer: Self-pay

## 2015-05-13 ENCOUNTER — Telehealth: Payer: Self-pay

## 2015-05-13 DIAGNOSIS — Z1211 Encounter for screening for malignant neoplasm of colon: Secondary | ICD-10-CM

## 2015-05-13 MED ORDER — NA SULFATE-K SULFATE-MG SULF 17.5-3.13-1.6 GM/177ML PO SOLN: ORAL | Status: DC

## 2015-05-13 NOTE — Progress Notes (Signed)
This encounter was created in error - please disregard.

## 2015-05-13 NOTE — Telephone Encounter (Signed)
Pt was scheduled for a colonoscopy at [facility] on 06-03-15. Pt was in office and received instructions and a rx.

## 2015-05-13 NOTE — Telephone Encounter (Signed)
Gastroenterology Pre-Procedure Review  Request Date: 06/03/15  Requesting Physician: Dr. Servando Snare  PATIENT REVIEW QUESTIONS: The patient responded to the following health history questions as indicated:    1. Are you having any GI issues? no 2. Do you have a personal history of Polyps? yes (Benign) 3. Do you have a family history of Colon Cancer or Polyps? no 4. Diabetes Mellitus? yes (controlled) 5. Joint replacements in the past 12 months?no 6. Major health problems in the past 3 months? No 7. Any artificial heart valves, MVP, or defibrillator?no    MEDICATIONS & ALLERGIES:    Patient reports the following regarding taking any anticoagulation/antiplatelet therapy:   Plavix, Coumadin, Eliquis, Xarelto, Lovenox, Pradaxa, Brilinta, or Effient? no Aspirin? no  Patient confirms/reports the following medications:  Current Outpatient Prescriptions  Medication Sig Dispense Refill  . atorvastatin (LIPITOR) 10 MG tablet Take 1 tablet (10 mg total) by mouth at bedtime. 90 tablet 2  . citalopram (CELEXA) 20 MG tablet Take 2 tablets (40 mg total) by mouth at bedtime. Take 40 mg by mouth daily at bedtime 180 tablet 2  . CONTRAVE 8-90 MG TB12 2 tabs in AM and PM  0  . docusate sodium (COLACE) 100 MG capsule Take 1 capsule (100 mg total) by mouth 3 (three) times daily as needed for constipation. 30 capsule 0  . esomeprazole (NEXIUM) 40 MG capsule Take 1 capsule (40 mg total) by mouth 2 (two) times daily with a meal. 180 capsule 2  . fluticasone (FLONASE) 50 MCG/ACT nasal spray Place 2 sprays into both nostrils daily. 16 g 6  . gabapentin (NEURONTIN) 400 MG capsule TAKE 1 CAPSULE (400 MG TOTAL) BY MOUTH 3 (THREE) TIMES DAILY. 270 capsule 1  . glucose blood (ONE TOUCH ULTRA TEST) test strip Test sugars 4x daily 150 each 12  . ibuprofen (ADVIL,MOTRIN) 200 MG tablet Take 3 tablets (600 mg total) by mouth every 6 (six) hours as needed. 180 tablet 2  . insulin aspart (NOVOLOG FLEXPEN) 100 UNIT/ML FlexPen  Inject [place] three times daily prior to meals Based on sliding scale between 6-16 units MDD, 60 units 15 mL 3  . Insulin Glargine (LANTUS SOLOSTAR) 100 UNIT/ML Solostar Pen Inject 20 Units into the skin daily at 10 pm. Titrate up to max dose of 32 units daily as instructed. (Patient taking differently: Inject 30 Units into the skin daily at 10 pm. Titrate up to max dose of 32 units daily as instructed.) 5 pen 3  . Insulin Pen Needle (BD PEN NEEDLE NANO U/F) 32G X 4 MM MISC Use once daily for insulin admin 30 each 4  . levothyroxine (SYNTHROID, LEVOTHROID) 100 MCG tablet Take 1 tablet (100 mcg total) by mouth daily. 30 tablet 3  . lisinopril (PRINIVIL,ZESTRIL) 10 MG tablet Take 1 tablet (10 mg total) by mouth daily. 90 tablet 2  . metFORMIN (GLUCOPHAGE) 1000 MG tablet Take 1 tablet (1,000 mg total) by mouth 2 (two) times daily with a meal. 180 tablet 2  . methocarbamol (ROBAXIN) 500 MG tablet Take 2 tablets by mouth three times a day. 540 tablet 1  . Multiple Vitamins-Minerals (CENTRUM SILVER ADULT 50+ PO) Take 1 tablet by mouth daily.    . Omega-3 Fatty Acids (FISH OIL) 300 MG CAPS Take 3 capsules by mouth daily.    . ondansetron (ZOFRAN) 4 MG tablet Take 1 tablet (4 mg total) by mouth as needed for nausea. 20 tablet 0  . ONETOUCH DELICA LANCETS 33G MISC Test sugars 2x daily 100  each 11  . QUEtiapine (SEROQUEL) 100 MG tablet Take 1 tablet (100 mg total) by mouth at bedtime. 90 tablet 2  . traZODone (DESYREL) 100 MG tablet Take 1 tablet (100 mg total) by mouth at bedtime. 90 tablet 2  . traZODone (DESYREL) 50 MG tablet Take 0.5-1 tablets (25-50 mg total) by mouth at bedtime as needed for sleep. 30 tablet 3   No current facility-administered medications for this visit.    Patient confirms/reports the following allergies:  No Known Allergies  No orders of the defined types were placed in this encounter.    AUTHORIZATION INFORMATION Primary Insurance: 1D#: Group #:  Secondary  Insurance: 1D#: Group #:  SCHEDULE INFORMATION: Date: 06/03/15 Time: Location: [place]

## 2015-05-17 NOTE — Assessment & Plan Note (Signed)
Appears clinically euthyroid today. She will continue current dose of levothyroxine for now. Last levels were at target.

## 2015-05-17 NOTE — Assessment & Plan Note (Signed)
Currently on statin.  Expect improvement in TGs with better sugars LDL at target on current statin

## 2015-05-17 NOTE — Assessment & Plan Note (Addendum)
Last A1c not controlled. Recent sugars have improved after recent start of Contrave and Novolog.  Continue current insulins for now, asked her not to self adjust medications. Asked her to continue checking sugars 4 x daily.  If starts to see lows, specially with further weight loss, then she was asked to call us for further insulin adjustments.  Increase hydration with water. Continue current Metformin.

## 2015-05-17 NOTE — Assessment & Plan Note (Signed)
She will continue working on her diet and exercise. She will work on eating small meals and not skipping any meals.  Continue current contrave per her therapist.

## 2015-05-21 ENCOUNTER — Other Ambulatory Visit: Payer: Self-pay | Admitting: *Deleted

## 2015-05-21 DIAGNOSIS — E039 Hypothyroidism, unspecified: Secondary | ICD-10-CM

## 2015-05-21 MED ORDER — LEVOTHYROXINE SODIUM 100 MCG PO TABS: 100.0000 ug | ORAL_TABLET | Freq: Every day | ORAL | Status: DC

## 2015-05-30 ENCOUNTER — Other Ambulatory Visit: Payer: Self-pay | Admitting: Nurse Practitioner

## 2015-06-02 NOTE — Discharge Instructions (Signed)

## 2015-06-03 ENCOUNTER — Ambulatory Visit
Admission: RE | Admit: 2015-06-03 | Discharge: 2015-06-03 | Disposition: A | Discharge status: Home / Self Care | Payer: BLUE CROSS/BLUE SHIELD | Source: Ambulatory Visit | Attending: Gastroenterology | Admitting: Gastroenterology

## 2015-06-03 ENCOUNTER — Ambulatory Visit: Payer: BLUE CROSS/BLUE SHIELD | Admitting: Anesthesiology

## 2015-06-03 ENCOUNTER — Encounter
Admission: RE | Disposition: A | Discharge status: Home / Self Care | Payer: Self-pay | Source: Ambulatory Visit | Attending: Gastroenterology

## 2015-06-03 DIAGNOSIS — K219 Gastro-esophageal reflux disease without esophagitis: Secondary | ICD-10-CM | POA: Insufficient documentation

## 2015-06-03 DIAGNOSIS — F419 Anxiety disorder, unspecified: Secondary | ICD-10-CM | POA: Diagnosis not present

## 2015-06-03 DIAGNOSIS — E039 Hypothyroidism, unspecified: Secondary | ICD-10-CM | POA: Diagnosis not present

## 2015-06-03 DIAGNOSIS — R51 Headache: Secondary | ICD-10-CM | POA: Insufficient documentation

## 2015-06-03 DIAGNOSIS — Z79899 Other long term (current) drug therapy: Secondary | ICD-10-CM | POA: Diagnosis not present

## 2015-06-03 DIAGNOSIS — E119 Type 2 diabetes mellitus without complications: Secondary | ICD-10-CM | POA: Diagnosis not present

## 2015-06-03 DIAGNOSIS — I10 Essential (primary) hypertension: Secondary | ICD-10-CM | POA: Diagnosis not present

## 2015-06-03 DIAGNOSIS — Z87891 Personal history of nicotine dependence: Secondary | ICD-10-CM | POA: Diagnosis not present

## 2015-06-03 DIAGNOSIS — Z8249 Family history of ischemic heart disease and other diseases of the circulatory system: Secondary | ICD-10-CM | POA: Diagnosis not present

## 2015-06-03 DIAGNOSIS — Z9660 Presence of unspecified orthopedic joint implant: Secondary | ICD-10-CM | POA: Diagnosis not present

## 2015-06-03 DIAGNOSIS — M179 Osteoarthritis of knee, unspecified: Secondary | ICD-10-CM | POA: Diagnosis not present

## 2015-06-03 DIAGNOSIS — M479 Spondylosis, unspecified: Secondary | ICD-10-CM | POA: Insufficient documentation

## 2015-06-03 DIAGNOSIS — Z1211 Encounter for screening for malignant neoplasm of colon: Secondary | ICD-10-CM | POA: Insufficient documentation

## 2015-06-03 DIAGNOSIS — Z833 Family history of diabetes mellitus: Secondary | ICD-10-CM | POA: Insufficient documentation

## 2015-06-03 HISTORY — PX: COLONOSCOPY WITH PROPOFOL: SHX5780

## 2015-06-03 HISTORY — DX: Unspecified osteoarthritis, unspecified site: M19.90

## 2015-06-03 LAB — GLUCOSE, CAPILLARY: Glucose-Capillary: 82 mg/dL (ref 65–99)

## 2015-06-03 SURGERY — COLONOSCOPY WITH PROPOFOL
Anesthesia: Monitor Anesthesia Care | Wound class: Contaminated

## 2015-06-03 MED ORDER — LIDOCAINE HCL (CARDIAC) 20 MG/ML IV SOLN
INTRAVENOUS | Status: DC | PRN
  Administered 2015-06-03: 50 mg via INTRAVENOUS

## 2015-06-03 MED ORDER — DEXAMETHASONE SODIUM PHOSPHATE 4 MG/ML IJ SOLN: 8.0000 mg | Freq: Once | INTRAMUSCULAR | Status: DC | PRN

## 2015-06-03 MED ORDER — PROPOFOL 10 MG/ML IV BOLUS
INTRAVENOUS | Status: DC | PRN
  Administered 2015-06-03: 50 mg via INTRAVENOUS
  Administered 2015-06-03: 40 mg via INTRAVENOUS
  Administered 2015-06-03: 30 mg via INTRAVENOUS
  Administered 2015-06-03: 50 mg via INTRAVENOUS
  Administered 2015-06-03: 40 mg via INTRAVENOUS
  Administered 2015-06-03: 30 mg via INTRAVENOUS
  Administered 2015-06-03: 40 mg via INTRAVENOUS

## 2015-06-03 MED ORDER — LACTATED RINGERS IV SOLN: 500.0000 mL | INTRAVENOUS | Status: DC

## 2015-06-03 MED ORDER — ACETAMINOPHEN 160 MG/5ML PO SOLN: 325.0000 mg | ORAL | Status: DC | PRN

## 2015-06-03 MED ORDER — FENTANYL CITRATE (PF) 100 MCG/2ML IJ SOLN: 25.0000 ug | INTRAMUSCULAR | Status: DC | PRN

## 2015-06-03 MED ORDER — SODIUM CHLORIDE 0.9 % IV SOLN: INTRAVENOUS | Status: DC

## 2015-06-03 MED ORDER — ACETAMINOPHEN 325 MG PO TABS: 325.0000 mg | ORAL_TABLET | ORAL | Status: DC | PRN

## 2015-06-03 MED ORDER — OXYCODONE HCL 5 MG PO TABS: 5.0000 mg | ORAL_TABLET | Freq: Once | ORAL | Status: DC | PRN

## 2015-06-03 MED ORDER — OXYCODONE HCL 5 MG/5ML PO SOLN
5.0000 mg | Freq: Once | ORAL | Status: DC | PRN
Start: 2015-06-03 — End: 2015-06-03

## 2015-06-03 MED ORDER — STERILE WATER FOR IRRIGATION IR SOLN
Status: DC | PRN
  Administered 2015-06-03: 09:00:00

## 2015-06-03 MED ORDER — LACTATED RINGERS IV SOLN
INTRAVENOUS | Status: DC
  Administered 2015-06-03: 08:00:00 via INTRAVENOUS

## 2015-06-03 SURGICAL SUPPLY — 28 items

## 2015-06-03 NOTE — H&P (Signed)
[facility]  [address], [address] [place], [place] [address] Phone: [phone] Fax : [phone]  Primary Care Physician:  Carollee Leitz, NP Primary Gastroenterologist:  Dr. Servando Snare  Pre-Procedure History & Physical: HPI:  Dawn Dean is a 54 y.o. female is here for a screening colonoscopy.   Past Medical History  Diagnosis Date  . Hypothyroidism   . Anxiety   . Mental disorder   . GERD (gastroesophageal reflux disease)   . Headache(784.0)   . Arthritis     NECK AND KNEE  . Diabetes mellitus without complication     TYPE II /diagnosed -20 yrs. ago     Past Surgical History  Procedure Laterality Date  . Knee arthroscopy Left   . Sinus surgery      for deviated septum  . Cesarean section      1985  . Dilation and curettage of uterus    . Cervical discectomy  12/05/2012  . Anterior cervical decomp/discectomy fusion  12/05/2012    Procedure: ANTERIOR CERVICAL DECOMPRESSION/DISCECTOMY FUSION 1 LEVEL;  Surgeon: Venita Lick, MD;  Location: [facility] OR;  Service: Orthopedics;  Laterality: Dawn Dean;  ACDF C5-6  . Joint replacement Left     APPROX 7 OR 8 YRS AGO    Prior to Admission medications   Medication Sig Start Date End Date Taking? Authorizing Provider  atorvastatin (LIPITOR) 10 MG tablet TAKE 1 TABLET (10 MG TOTAL) BY MOUTH ONCE DAILY. 06/01/15  Yes Carollee Leitz, NP  citalopram (CELEXA) 20 MG tablet Take 2 tablets (40 mg total) by mouth at bedtime. Take 40 mg by mouth daily at bedtime 12/08/14  Yes Carollee Leitz, NP  CONTRAVE 8-90 MG TB12 2 tabs in AM and PM 04/15/15  Yes Historical Provider, MD  docusate sodium (COLACE) 100 MG capsule Take 1 capsule (100 mg total) by mouth 3 (three) times daily as needed for constipation. 12/06/12  Yes Venita Lick, MD  esomeprazole (NEXIUM) 40 MG capsule Take 1 capsule (40 mg total) by mouth 2 (two) times daily with a meal. 12/08/14  Yes Carollee Leitz, NP  fluticasone (FLONASE) 50 MCG/ACT nasal spray Place 2 sprays into both  nostrils daily. Patient taking differently: Place 2 sprays into both nostrils as needed.  12/08/14  Yes Carollee Leitz, NP  gabapentin (NEURONTIN) 400 MG capsule TAKE 1 CAPSULE (400 MG TOTAL) BY MOUTH 3 (THREE) TIMES DAILY. 12/21/14  Yes Carollee Leitz, NP  ibuprofen (ADVIL,MOTRIN) 200 MG tablet Take 3 tablets (600 mg total) by mouth every 6 (six) hours as needed. 01/26/15  Yes Carollee Leitz, NP  insulin aspart (NOVOLOG FLEXPEN) 100 UNIT/ML FlexPen Inject [place] three times daily prior to meals Based on sliding scale between 6-16 units MDD, 60 units 04/01/15  Yes Radhika Okey Dupre, MD  Insulin Glargine (LANTUS SOLOSTAR) 100 UNIT/ML Solostar Pen Inject 20 Units into the skin daily at 10 pm. Titrate up to max dose of 32 units daily as instructed. Patient taking differently: Inject 30 Units into the skin daily at 10 pm. Titrate up to max dose of 32 units daily as instructed. 01/27/15  Yes Radhika P Phadke, MD  levothyroxine (SYNTHROID, LEVOTHROID) 100 MCG tablet Take 1 tablet (100 mcg total) by mouth daily. Patient taking differently: Take 100 mcg by mouth daily before breakfast.  05/21/15  Yes Radhika P Phadke, MD  lisinopril (PRINIVIL,ZESTRIL) 10 MG tablet Take 1 tablet (10 mg total) by mouth daily. Patient taking differently: Take 10 mg by mouth daily.  AM 12/08/14  Yes Carollee Leitzarrie M Doss, NP  metFORMIN (GLUCOPHAGE) 1000 MG tablet Take 1 tablet (1,000 mg total) by mouth 2 (two) times daily with a meal. 12/08/14  Yes Carollee Leitzarrie M Doss, NP  methocarbamol (ROBAXIN) 500 MG tablet Take 2 tablets by mouth three times a day. Patient taking differently: every 8 (eight) hours as needed. Take 2 tablets by mouth three times a day. 04/08/15  Yes Carollee Leitzarrie M Doss, NP  Multiple Vitamins-Minerals (CENTRUM SILVER ADULT 50+ PO) Take 1 tablet by mouth daily. PM   Yes Historical Provider, MD  Na Sulfate-K Sulfate-Mg Sulf (SUPREP BOWEL PREP) SOLN Per instructions given at appt 05/13/15  Yes Midge Miniumarren Zachary Nole, MD  Omega-3 Fatty Acids (FISH OIL) 300 MG CAPS  Take 3 capsules by mouth daily. PM   Yes Historical Provider, MD  ondansetron (ZOFRAN) 4 MG tablet Take 1 tablet (4 mg total) by mouth as needed for nausea. 04/08/15  Yes Carollee Leitzarrie M Doss, NP  QUEtiapine (SEROQUEL) 100 MG tablet Take 1 tablet (100 mg total) by mouth at bedtime. 12/08/14  Yes Carollee Leitzarrie M Doss, NP  traZODone (DESYREL) 100 MG tablet Take 1 tablet (100 mg total) by mouth at bedtime. 12/08/14  Yes Carollee Leitzarrie M Doss, NP  glucose blood (ONE TOUCH ULTRA TEST) test strip Test sugars 4x daily 04/19/15   Quentin Cornwalladhika P Phadke, MD  Insulin Pen Needle (BD PEN NEEDLE NANO U/F) 32G X 4 MM MISC Use once daily for insulin admin 01/27/15   Quentin Cornwalladhika P Phadke, MD  ONETOUCH DELICA LANCETS 33G MISC Test sugars 2x daily 11/10/14   Quentin Cornwalladhika P Phadke, MD    Allergies as of 05/13/2015  . (No Known Allergies)    Family History  Problem Relation Age of Onset  . Diabetes Cousin   . Heart disease Other     History   Social History  . Marital Status: Married    Spouse Name: Dawn Dean  . Number of Children: Dawn Dean  . Years of Education: Dawn Dean   Occupational History  . Not on file.   Social History Main Topics  . Smoking status: Former Smoker -- 1.50 packs/day for 10 years    Types: Cigarettes    Quit date: 12/05/1993  . Smokeless tobacco: Never Used  . Alcohol Use: No     Comment: very seldom   . Drug Use: No  . Sexual Activity:    Partners: Male   Other Topics Concern  . Not on file   Social History Narrative   Married 30 years to husband   Living [place] in [place]   1 son 56 yo   2 dogs- taz and oreo   1 cat- pumpkin   Garment/textile technologistAssociate RN    Currently not working, will start looking for work early 2016    - On the Conservation officer, [employer]        Review of Systems: See HPI, otherwise negative ROS  Physical Exam: BP 114/72 mmHg  Pulse 73  Temp(Src) 97.9 F (36.6 C) (Temporal)  Resp 16  Ht 5\' 5"  (1.651 m)  Wt 217 lb (98.431 kg)  BMI 36.11 kg/m2  SpO2 98%  LMP  10/21/2012 General:   Alert,  pleasant and cooperative in NAD Head:  Normocephalic and atraumatic. Neck:  Supple; no masses or thyromegaly. Lungs:  Clear throughout to auscultation.    Heart:  Regular rate and rhythm. Abdomen:  Soft, nontender and nondistended. Normal bowel sounds, without guarding, and without rebound.   Neurologic:  Alert and  oriented x4;  grossly normal neurologically.  Impression/Plan: Dawn Dean is now here to undergo a screening colonoscopy.  Risks, benefits, and alternatives regarding colonoscopy have been reviewed with the patient.  Questions have been answered.  All parties agreeable.

## 2015-06-03 NOTE — Transfer of Care (Signed)
Immediate Anesthesia Transfer of Care Note  Patient: Dawn RondoKimberly P Christoffel  Procedure(s) Performed: Procedure(s): COLONOSCOPY WITH PROPOFOL ([name])  Patient Location: PACU  Anesthesia Type: MAC  Level of Consciousness: awake, alert  and patient cooperative  Airway and Oxygen Therapy: Patient Spontanous Breathing and Patient connected to supplemental oxygen  Post-op Assessment: Post-op Vital signs reviewed, Patient's Cardiovascular Status Stable, Respiratory Function Stable, Patent Airway and No signs of Nausea or vomiting  Post-op Vital Signs: Reviewed and stable  Complications: No apparent anesthesia complications

## 2015-06-03 NOTE — Anesthesia Postprocedure Evaluation (Signed)
  Anesthesia Post-op Note  Patient: Sherrye PayorKimberly P [place]ale  Procedure(s) Performed: Procedure(s): COLONOSCOPY WITH PROPOFOL ([name])  Anesthesia type:MAC  Patient location: PACU  Post pain: Pain level controlled  Post assessment: Post-op Vital signs reviewed, Patient's Cardiovascular Status Stable, Respiratory Function Stable, Patent Airway and No signs of Nausea or vomiting  Post vital signs: Reviewed and stable  Last Vitals:  Filed Vitals:   06/03/15 0900  BP: 98/56  Pulse: 77  Temp:   Resp: 17    Level of consciousness: awake, alert  and patient cooperative  Complications: No apparent anesthesia complications

## 2015-06-03 NOTE — Anesthesia Preprocedure Evaluation (Addendum)
Anesthesia Evaluation  Patient identified by MRN, date of birth, ID band Patient awake    Reviewed: Allergy & Precautions, H&P , NPO status , Patient's Chart, lab work & pertinent test results, reviewed documented beta blocker date and time   Airway Mallampati: III  TM Distance: >3 FB Neck ROM: limited    Dental no notable dental hx.    Pulmonary neg pulmonary ROS, former smoker,  breath sounds clear to auscultation  Pulmonary exam normal       Cardiovascular Exercise Tolerance: Good hypertension, On Medications Rhythm:regular Rate:Normal     Neuro/Psych  Headaches, PSYCHIATRIC DISORDERS Cervalgia  Neuromuscular disease    GI/Hepatic Neg liver ROS, GERD-  ,  Endo/Other  diabetes, Well Controlled, Type 2, Insulin DependentHypothyroidism   Renal/GU negative Renal ROS  negative genitourinary   Musculoskeletal  (+) Arthritis -, Osteoarthritis,    Abdominal   Peds  Hematology negative hematology ROS (+)   Anesthesia Other Findings   Reproductive/Obstetrics negative OB ROS                           Anesthesia Physical Anesthesia Plan  ASA: III  Anesthesia Plan: MAC   Post-op Pain Management:    Induction:   Airway Management Planned:   Additional Equipment:   Intra-op Plan:   Post-operative Plan:   Informed Consent: I have reviewed the patients History and Physical, chart, labs and discussed the procedure including the risks, benefits and alternatives for the proposed anesthesia with the patient or authorized representative who has indicated his/her understanding and acceptance.     Plan Discussed with: CRNA  Anesthesia Plan Comments:        Anesthesia Quick Evaluation

## 2015-06-03 NOTE — Anesthesia Procedure Notes (Signed)
Procedure Name: MAC Performed by: Prentiss Hammett Pre-anesthesia Checklist: Patient identified, Emergency Drugs available, Suction available, Timeout performed and Patient being monitored Patient Re-evaluated:Patient Re-evaluated prior to inductionOxygen Delivery Method: Nasal cannula Placement Confirmation: positive ETCO2       

## 2015-06-03 NOTE — Op Note (Signed)
[facility] Patient Name: Dawn LoryKimberly Gault Procedure Date: 06/03/2015 8:29 AM MRN: [id number] Account #: [id number] Date of Birth: 08/04/1961 Admit Type: Outpatient Age: 54 [facility] Gender: Female Note Status: Finalized Procedure:         Colonoscopy Indications:       Screening for colorectal malignant neoplasm Providers:         Midge Miniumarren Alla Sloma, MD Medicines:         Propofol per Anesthesia Complications:     No immediate complications. Procedure:         Pre-Anesthesia Assessment:                    - Prior to the procedure, a History and Physical was                     performed, and patient medications and allergies were                     reviewed. The patient's tolerance of previous anesthesia                     was also reviewed. The risks and benefits of the procedure                     and the sedation options and risks were discussed with the                     patient. All questions were answered, and informed consent                     was obtained. Prior Anticoagulants: The patient has taken                     no previous anticoagulant or antiplatelet agents. ASA                     Grade Assessment: II - A patient with mild systemic                     disease. After reviewing the risks and benefits, the                     patient was deemed in satisfactory condition to undergo                     the procedure.                    After obtaining informed consent, the colonoscope was                     passed under direct vision. Throughout the procedure, the                     patient's blood pressure, pulse, and oxygen saturations                     were monitored continuously. The Olympus CF H180AL                     colonoscope (S#: [id number]) was introduced through the anus                     and advanced to the the cecum, identified by  appendiceal                     orifice and ileocecal valve. The  colonoscopy was performed                     without difficulty. The patient tolerated the procedure                     well. The quality of the bowel preparation was excellent. Findings:      The perianal and digital rectal examinations were normal.      The colon (entire examined portion) appeared normal. Impression:        - The entire examined colon is normal.                    - No specimens collected. Recommendation:    - Repeat colonoscopy in 10 years for screening unless any                     change in family history or lower GI problems. Procedure Code(s): --- Professional ---                    [phone], Colonoscopy, flexible; diagnostic, including                     collection of specimen(s) by brushing or washing, when                     performed (separate procedure) Diagnosis Code(s): --- Professional ---                    Z12.11, Encounter for screening for malignant neoplasm of                     colon CPT copyright 2014 American Medical Association. All rights reserved. The codes documented in this report are preliminary and upon coder review may  be revised to meet current compliance requirements. Midge Minium, MD 06/03/2015 8:58:14 AM This report has been signed electronically. Number of Addenda: 0 Note Initiated On: 06/03/2015 8:29 AM Scope Withdrawal Time: 0 hours 11 minutes 14 seconds  Total Procedure Duration: 0 hours 16 minutes 20 seconds       [facility]

## 2015-06-04 ENCOUNTER — Encounter: Payer: Self-pay | Admitting: Gastroenterology

## 2015-06-05 ENCOUNTER — Other Ambulatory Visit: Payer: Self-pay | Admitting: Nurse Practitioner

## 2015-06-07 IMAGING — CR DG CHEST 2V
1 series · 3 of 3 positions shown · non-contrast
Comparison: none

REASON FOR EXAM: sob
COMMENTS:

[Series 1: pa · 0.17mm/px · 3 of 3 slices shown]
[im 1/3]
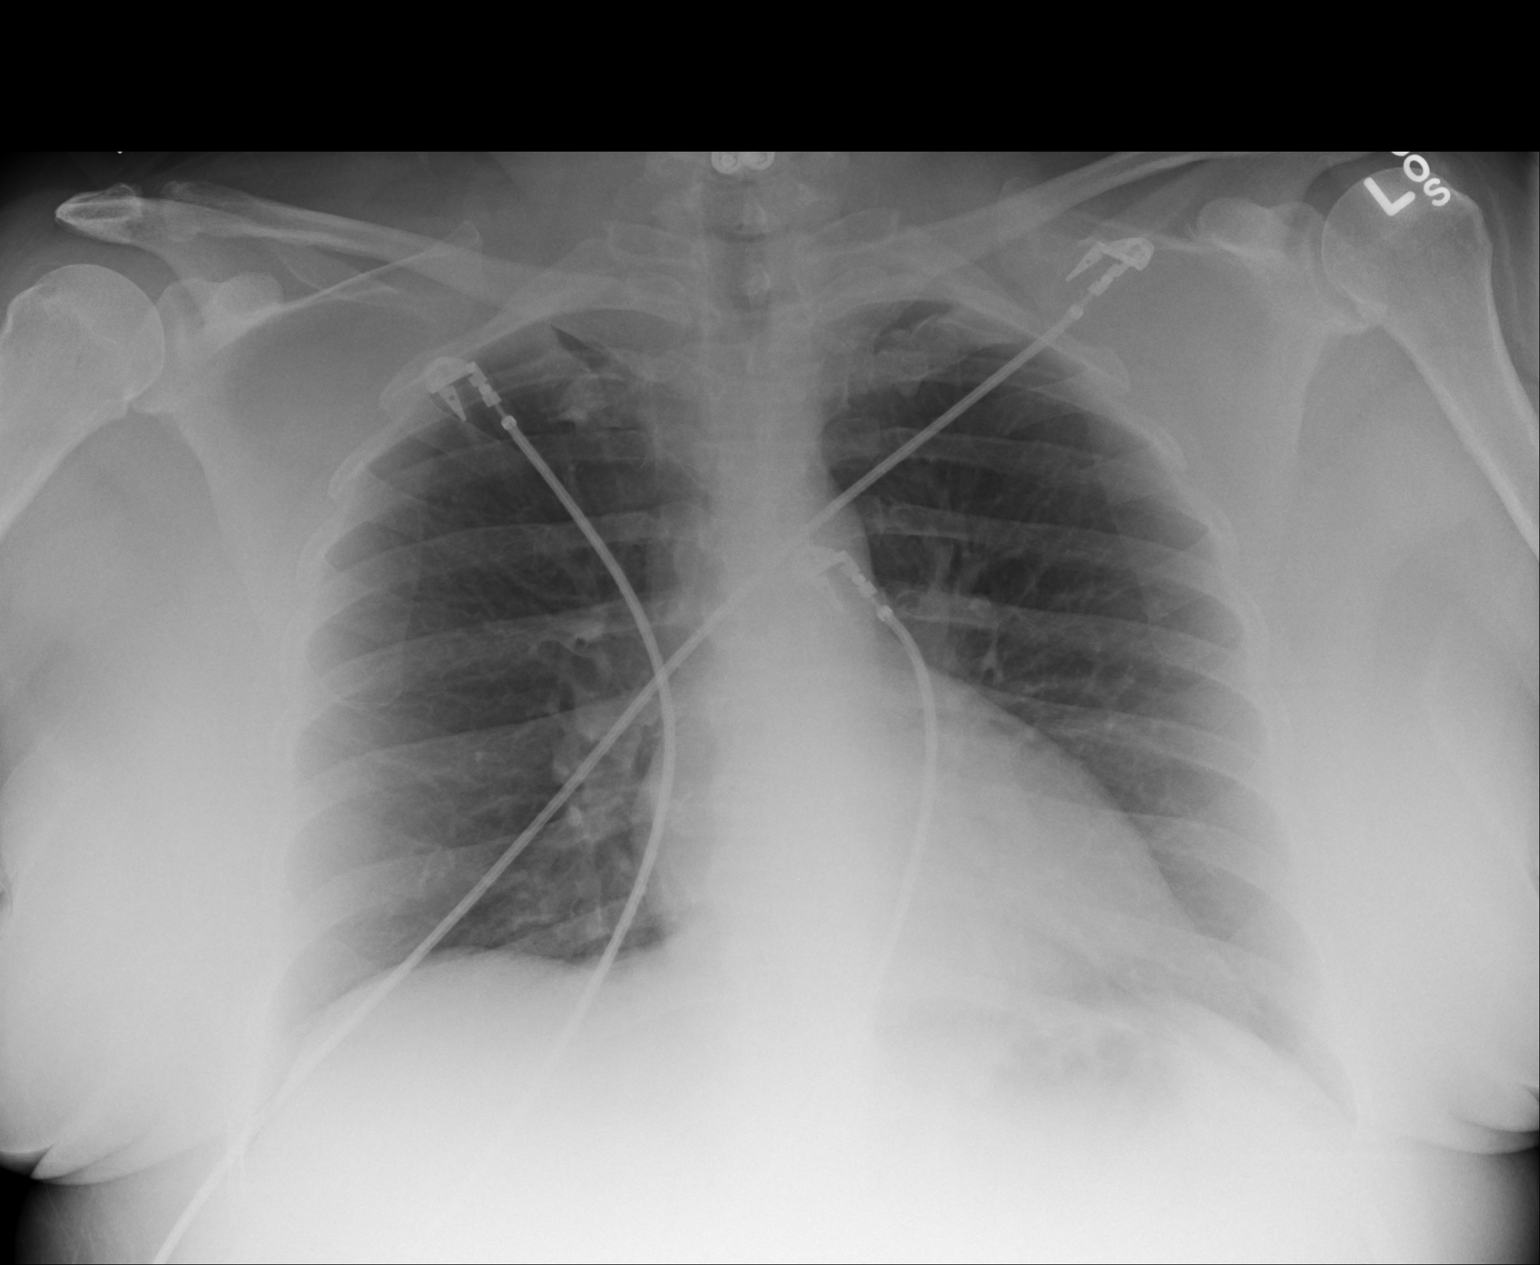
[im 2/3]
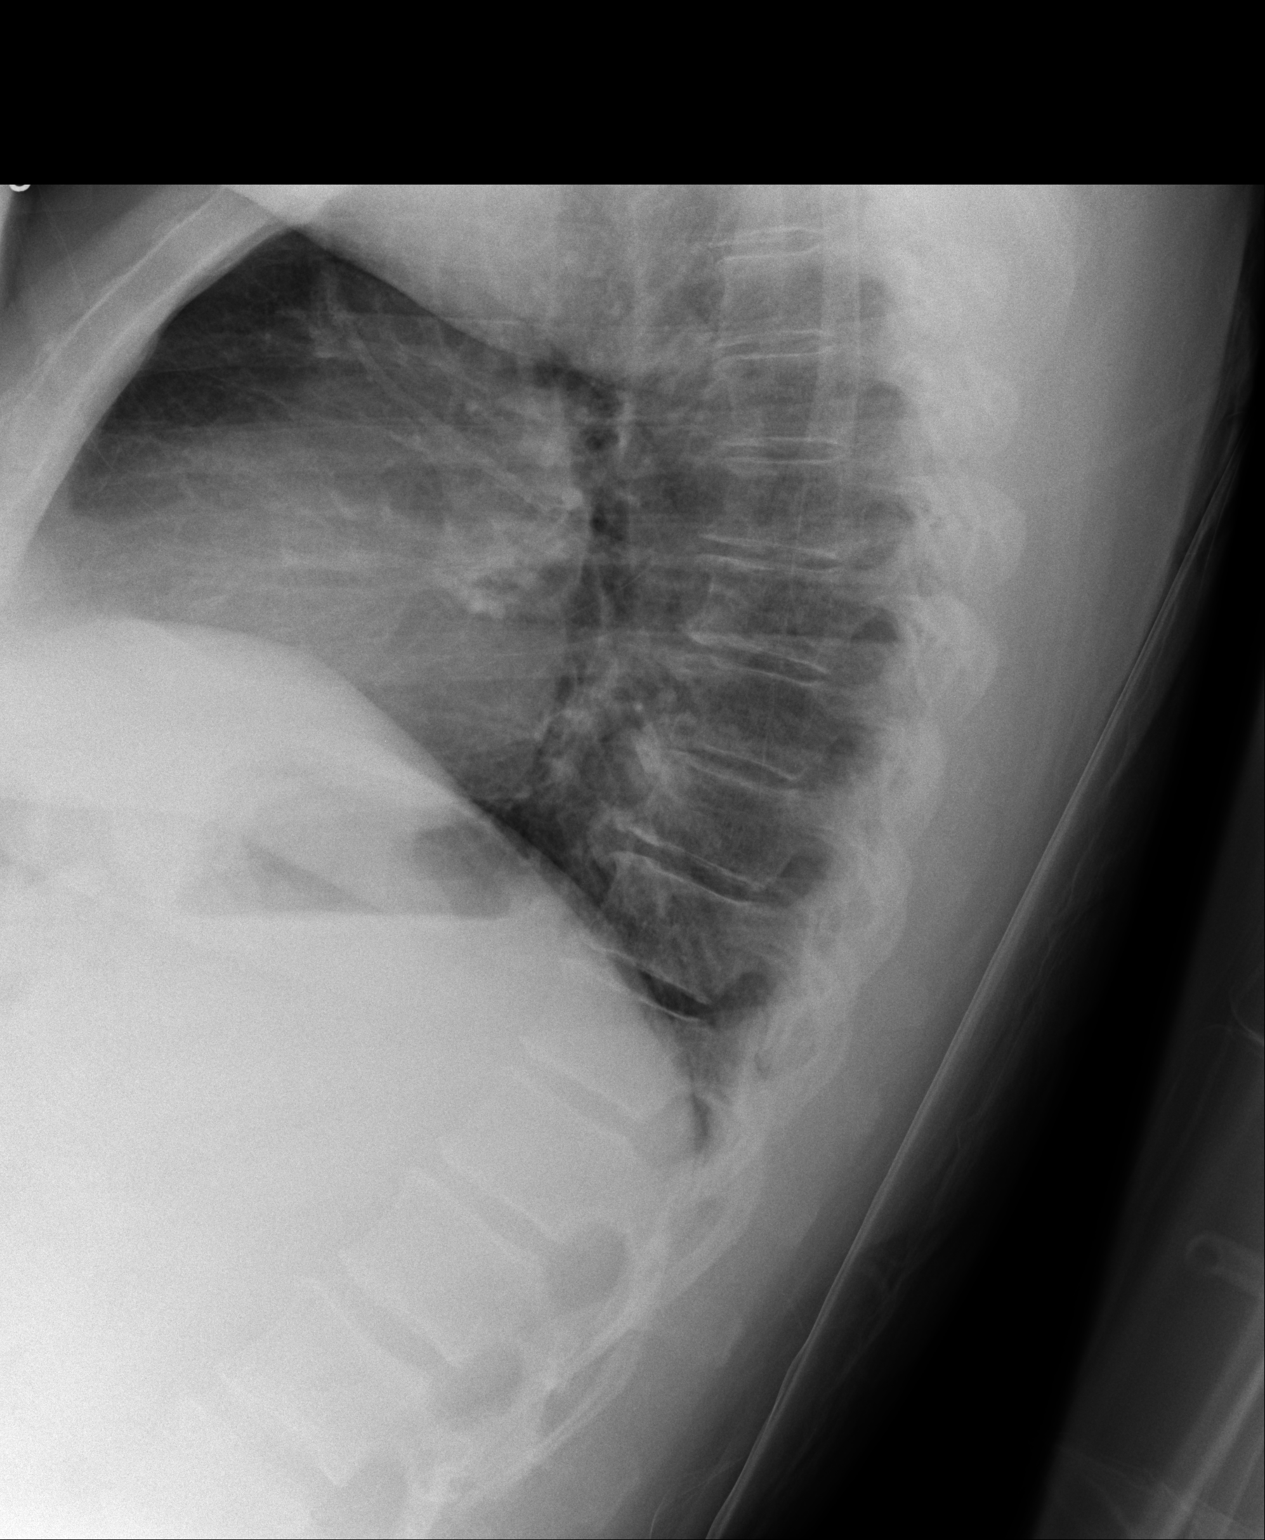
[im 3/3]
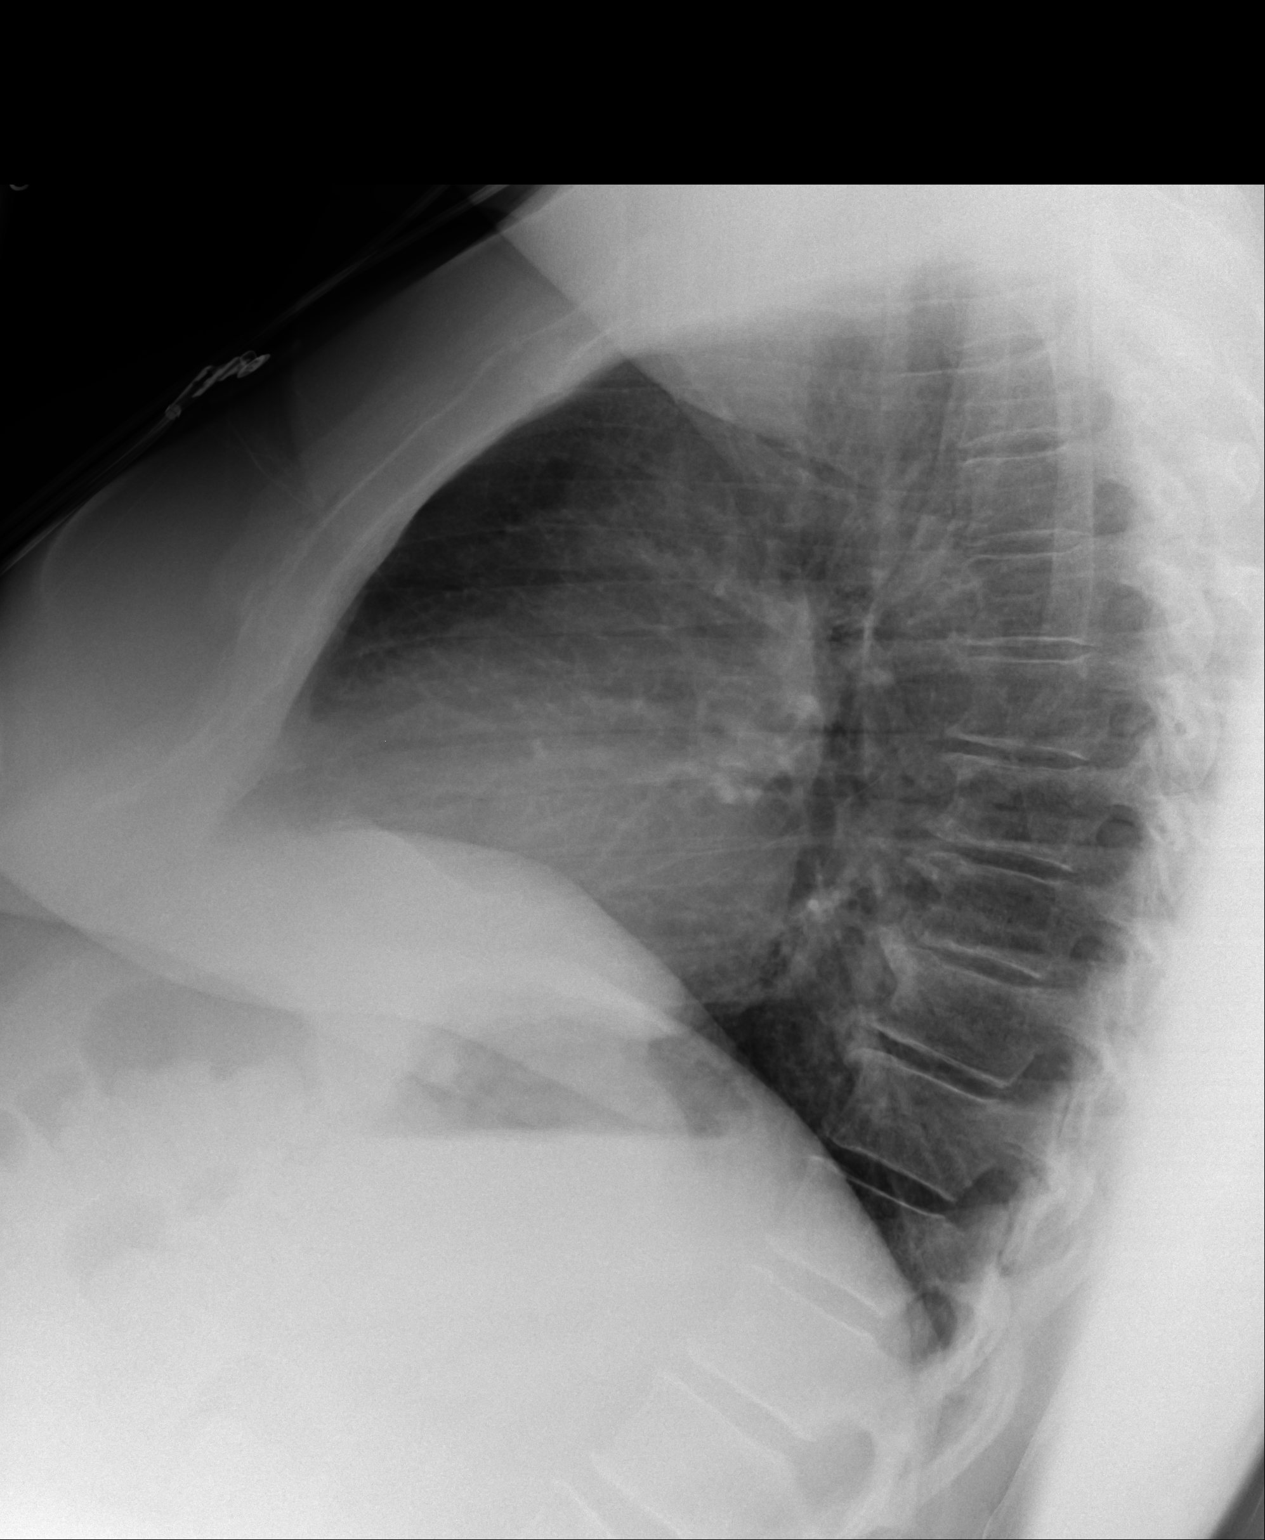

[3 of 3 positions shown; findings below may reference images not displayed]

PROCEDURE:     DXR - DXR CHEST PA (OR AP) AND LATERAL  - August 28, 2013  [DATE]

RESULT:     Comparison is made to the study October 13, 2011.

The lungs are adequately inflated. There is no focal infiltrate. The cardiac
silhouette is top normal in size but stable. There is mild stable prominence
of the central pulmonary vascularity. There is no pleural effusion or
pneumothorax. Mild degenerative disc changes noted in the lower thoracic
spine.
IMPRESSION: There is no evidence of acute cardiopulmonary abnormality.

[REDACTED]

## 2015-06-08 LAB — HM DIABETES EYE EXAM

## 2015-06-15 ENCOUNTER — Other Ambulatory Visit: Payer: Self-pay | Admitting: *Deleted

## 2015-06-15 MED ORDER — INSULIN GLARGINE 100 UNIT/ML SOLOSTAR PEN: 20.0000 [IU] | PEN_INJECTOR | Freq: Every day | SUBCUTANEOUS | Status: DC

## 2015-06-30 ENCOUNTER — Ambulatory Visit: Payer: Self-pay | Admitting: Internal Medicine

## 2015-07-05 ENCOUNTER — Ambulatory Visit (INDEPENDENT_AMBULATORY_CARE_PROVIDER_SITE_OTHER): Payer: BLUE CROSS/BLUE SHIELD | Admitting: Internal Medicine

## 2015-07-05 ENCOUNTER — Other Ambulatory Visit: Payer: Self-pay | Admitting: Nurse Practitioner

## 2015-07-05 ENCOUNTER — Other Ambulatory Visit: Payer: Self-pay | Admitting: *Deleted

## 2015-07-05 ENCOUNTER — Encounter: Payer: Self-pay | Admitting: Internal Medicine

## 2015-07-05 VITALS — BP 114/68 | HR 87 | Temp 98.0°F | Resp 12 | Ht 65.25 in | Wt 229.0 lb

## 2015-07-05 DIAGNOSIS — E1165 Type 2 diabetes mellitus with hyperglycemia: Secondary | ICD-10-CM

## 2015-07-05 MED ORDER — EMPAGLIFLOZIN 25 MG PO TABS: 25.0000 mg | ORAL_TABLET | Freq: Every day | ORAL | Status: DC

## 2015-07-05 MED ORDER — CANAGLIFLOZIN 100 MG PO TABS: 100.0000 mg | ORAL_TABLET | Freq: Every day | ORAL | Status: DC

## 2015-07-05 NOTE — Telephone Encounter (Signed)
Ins has declined to fill Invokana 100 mg. Dr Elvera Lennox is switching pt to Jardiance 25 mg, once daily.

## 2015-07-05 NOTE — Patient Instructions (Addendum)
Please continue: - Metformin 1000 mg 2x a day with meals - Lantus 20 units at bedtime - NovoLog 6 units 3x a day, before meals + SSI  Please start Invokana 100 in am.  Please return in 1.5 months with your sugar log.   PATIENT INSTRUCTIONS FOR TYPE 2 DIABETES:  **Please join MyChart!** - see attached instructions about how to join if you have not done so already.  DIET AND EXERCISE Diet and exercise is an important part of diabetic treatment.  We recommended aerobic exercise in the form of brisk walking (working between 40-60% of maximal aerobic capacity, similar to brisk walking) for 150 minutes per week (such as 30 minutes five days per week) along with 3 times per week performing 'resistance' training (using various gauge rubber tubes with handles) 5-10 exercises involving the major muscle groups (upper body, lower body and core) performing 10-15 repetitions (or near fatigue) each exercise. Start at half the above goal but build slowly to reach the above goals. If limited by weight, joint pain, or disability, we recommend daily walking in a swimming pool with water up to waist to reduce pressure from joints while allow for adequate exercise.    BLOOD GLUCOSES Monitoring your blood glucoses is important for continued management of your diabetes. Please check your blood glucoses 2-4 times a day: fasting, before meals and at bedtime (you can rotate these measurements - e.g. one day check before the 3 meals, the next day check before 2 of the meals and before bedtime, etc.).   HYPOGLYCEMIA (low blood sugar) Hypoglycemia is usually a reaction to not eating, exercising, or taking too much insulin/ other diabetes drugs.  Symptoms include tremors, sweating, hunger, confusion, headache, etc. Treat IMMEDIATELY with 15 grams of Carbs: . 4 glucose tablets .  cup regular juice/soda . 2 tablespoons raisins . 4 teaspoons sugar . 1 tablespoon honey Recheck blood glucose in 15 mins and repeat above  if still symptomatic/blood glucose <100.  RECOMMENDATIONS TO REDUCE YOUR RISK OF DIABETIC COMPLICATIONS: * Take your prescribed MEDICATION(S) * Follow a DIABETIC diet: Complex carbs, fiber rich foods, (monounsaturated and polyunsaturated) fats * AVOID saturated/trans fats, high fat foods, >2,300 mg salt per day. * EXERCISE at least 5 times a week for 30 minutes or preferably daily.  * DO NOT SMOKE OR DRINK more than 1 drink a day. * Check your FEET every day. Do not wear tightfitting shoes. Contact us if you develop an ulcer * See your EYE doctor once a year or more if needed * Get a FLU shot once a year * Get a PNEUMONIA vaccine once before and once after age 42 years  GOALS:  * Your Hemoglobin A1c of <7%  * fasting sugars need to be <130 * after meals sugars need to be <180 (2h after you start eating) * Your Systolic BP should be 140 or lower  * Your Diastolic BP should be 80 or lower  * Your HDL (Good Cholesterol) should be 40 or higher  * Your LDL (Bad Cholesterol) should be 100 or lower. * Your Triglycerides should be 150 or lower  * Your Urine microalbumin (kidney function) should be <30 * Your Body Mass Index should be 25 or lower    Please consider the following ways to cut down carbs and fat and increase fiber and micronutrients in your diet: - substitute whole grain for white bread or pasta - substitute brown rice for white rice - substitute 90-calorie flat bread pieces for slices  of bread when possible - substitute sweet potatoes or yams for white potatoes - substitute humus for margarine - substitute tofu for cheese when possible - substitute almond or rice milk for regular milk (would not drink soy milk daily due to concern for soy estrogen influence on breast cancer risk) - substitute dark chocolate for other sweets when possible - substitute water - can add lemon or orange slices for taste - for diet sodas (artificial sweeteners will trick your body that you can eat  sweets without getting calories and will lead you to overeating and weight gain in the long run) - do not skip breakfast or other meals (this will slow down the metabolism and will result in more weight gain over time)  - can try smoothies made from fruit and almond/rice milk in am instead of regular breakfast - can also try old-fashioned (not instant) oatmeal made with almond/rice milk in am - order the dressing on the side when eating salad at a restaurant (pour less than half of the dressing on the salad) - eat as little meat as possible - can try juicing, but should not forget that juicing will get rid of the fiber, so would alternate with eating raw veg./fruits or drinking smoothies - use as little oil as possible, even when using olive oil - can dress a salad with a mix of balsamic vinegar and lemon juice, for e.g. - use agave nectar, stevia sugar, or regular sugar rather than artificial sweateners - steam or broil/roast veggies  - snack on veggies/fruit/nuts (unsalted, preferably) when possible, rather than processed foods - reduce or eliminate aspartame in diet (it is in diet sodas, chewing gum, etc) Read the labels!  Try to read Dr. Katherina Right book: "Program for Reversing Diabetes" for other ideas for healthy eating.

## 2015-07-05 NOTE — Progress Notes (Signed)
Patient ID: Dawn Dean, female   DOB: Mar 31, 1961, 54 y.o.   MRN: [id number]  HPI: Dawn Dean is a 54 y.o.-year-old female, referred by her PCP, Dr. Tami Ribas, for management of DM2, dx in ~1995 PCOS before), insulin-dependent since 01/2015, uncontrolled, without complications and hypothyroidism. She was previously seen by Dr Welford Roche who since left the practice.  She lost weight (16 lbs in 3 month) >> portion control.   DM2: Last hemoglobin A1c was: Lab Results  Component Value Date   HGBA1C 9.2* 05/11/2015   HGBA1C 10.2* 01/26/2015   HGBA1C 7.5* 09/28/2014   Pt is on a regimen of: - Metformin 1000 mg 2x a day, with meals - Lantus 20 units at bedtime - Novolog 6 units 3x a day, before meals  Per review of Dr Ephriam Jenkins note from 04/2015: Tried  Metformin, Invokana, Bydureon, Lantus, Glipizide/Glimepiride/Glyburide, Actos/Avandia, Bydureon/Tanzeum in the past.  * Didn't tolerate Tanzeum due to GI side effects (nausea, gastritis). *taken off Lantus and Bydureon by [facility] due to lows about Sept 2015- Invokana dose reduced from 300 mg to 100 mg around that time * Invokana 100 mg daily taken off due to insurance jan 2016>>switched to [place] * taken off Farxiga as GFR on the lower side at 57 in March 2016 and started on Lantus.   Pt checks her sugars 3x a day and they are: - am: 76, 80-160 - 2h after b'fast: 112-291 - before lunch: 108, 114-225, 315, 429 (party) - 2h after lunch: 316 - before dinner: 95-159, 296 - 2h after dinner: 101-193 - bedtime: 97-199, 273 - nighttime: n/c + lows. Lowest sugar was 57 (in am), but some 60-70s; she has hypoglycemia awareness at 70.  Highest sugar was 200s.  Glucometer: One Touch Ultra Mini  Pt's meals are: - Breakfast: Yoghurt +/- 1/2 banana; eggs + toast or grits; oatmeal - Lunch: may skip; [place] sandwich (1/2) +/- a salad; sometimes soup (Progressive) - Dinner: chicken breast + steamed veggies + bread or pasta - Snacks:  usually no; sometimes popcorn; PB sandwich  - no CKD, last BUN/creatinine:  Lab Results  Component Value Date   BUN 25* 05/11/2015   CREATININE 0.95 05/11/2015  On Lisinopril. - last set of lipids: Lab Results  Component Value Date   CHOL 169 05/11/2015   HDL 32.70* 05/11/2015   LDLCALC 89 05/19/2014   LDLDIRECT 88.0 05/11/2015   TRIG 329.0* 05/11/2015   CHOLHDL 5 05/11/2015  On Lipitor. - last eye exam was in 05/2015. No DR.  - no numbness and tingling in her feet.  On Neurontin 400 mg 3x a day (for anxiety). She sees a Therapist, sports.  Pt has FH of DM in father's cousin (DM1).   She also has hypothyroidism:  She is on LT4 100 mcg daily, with normal TFTs at last checks: Lab Results  Component Value Date   TSH 2.20 04/02/2015   TSH 2.29 01/26/2015   TSH 5.83 09/28/2014   TSH 0.90 05/19/2014   FREET4 0.76 04/02/2015   FREET4 0.88 01/26/2015   She is taking the LT4 correctly: - in am - fasting - eats b'fast >30 min later - no Ca, Fe - on Nexium in am and hs (the am dose along with LT4!) - + MVI in HS  She has a h/o substance abuse.  ROS: Constitutional: + weight loss, no fatigue, no subjective hyperthermia/hypothermia Eyes: no blurry vision, no xerophthalmia ENT: no sore throat, no nodules palpated in throat, no dysphagia/odynophagia, no hoarseness Cardiovascular:  no CP/SOB/palpitations/leg swelling Respiratory: no cough/SOB Gastrointestinal: no N/V/D/C Musculoskeletal: no muscle/joint aches Skin: no rashes Neurological: no tremors/numbness/tingling/dizziness Psychiatric: no depression/anxiety + low libido   Past Medical History  Diagnosis Date  . Hypothyroidism   . Anxiety   . Mental disorder   . GERD (gastroesophageal reflux disease)   . Headache(784.0)   . Arthritis     NECK AND KNEE  . Diabetes mellitus without complication     TYPE II /diagnosed -20 yrs. ago    Past Surgical History  Procedure Laterality Date  . Knee arthroscopy Left   .  Sinus surgery      for deviated septum  . Cesarean section      1985  . Dilation and curettage of uterus    . Cervical discectomy  12/05/2012  . Anterior cervical decomp/discectomy fusion  12/05/2012    Procedure: ANTERIOR CERVICAL DECOMPRESSION/DISCECTOMY FUSION 1 LEVEL;  Surgeon: Venita Lick, MD;  Location: [facility] OR;  Service: Orthopedics;  Laterality: [name];  ACDF C5-6  . Joint replacement Left     APPROX 7 OR 8 YRS AGO  . Colonoscopy with propofol [name] 06/03/2015    Procedure: COLONOSCOPY WITH PROPOFOL;  Surgeon: Midge Minium, MD;  [facility];  Service: Endoscopy;  Laterality: [name];   History   Social History Main Topics  . Smoking status: Former Smoker -- 1.50 packs/day for 10 years    Types: Cigarettes    Quit date: 12/05/1993  . Smokeless tobacco: Never Used  . Alcohol Use: No     Comment: very seldom   . Drug Use: No   Social History Narrative   Married 30 years to husband   Living together in [place]   1 son 14 yo   2 dogs- taz and oreo   1 cat- pumpkin   Garment/textile technologist    Currently not working, will start looking for work early 2016    - On the Conservation officer, nature for [employer]    Current Outpatient Prescriptions on File Prior to Visit  Medication Sig Dispense Refill  . atorvastatin (LIPITOR) 10 MG tablet TAKE 1 TABLET (10 MG TOTAL) BY MOUTH ONCE DAILY. 90 tablet 1  . citalopram (CELEXA) 20 MG tablet Take 2 tablets (40 mg total) by mouth at bedtime. Take 40 mg by mouth daily at bedtime 180 tablet 2  . CONTRAVE 8-90 MG TB12 2 tabs in AM and PM  0  . docusate sodium (COLACE) 100 MG capsule Take 1 capsule (100 mg total) by mouth 3 (three) times daily as needed for constipation. 30 capsule 0  . esomeprazole (NEXIUM) 40 MG capsule Take 1 capsule (40 mg total) by mouth 2 (two) times daily with a meal. 180 capsule 2  . fluticasone (FLONASE) 50 MCG/ACT nasal spray Place 2 sprays into both nostrils daily. (Patient taking differently: Place 2 sprays  into both nostrils as needed. ) 16 g 6  . gabapentin (NEURONTIN) 400 MG capsule TAKE 1 CAPSULE (400 MG TOTAL) BY MOUTH 3 (THREE) TIMES DAILY. 270 capsule 1  . glucose blood (ONE TOUCH ULTRA TEST) test strip Test sugars 4x daily 150 each 12  . ibuprofen (ADVIL,MOTRIN) 200 MG tablet Take 3 tablets (600 mg total) by mouth every 6 (six) hours as needed. 180 tablet 2  . insulin aspart (NOVOLOG FLEXPEN) 100 UNIT/ML FlexPen Inject [place] three times daily prior to meals Based on sliding scale between 6-16 units MDD, 60 units 15 mL 3  . Insulin Glargine (LANTUS SOLOSTAR) 100  UNIT/ML Solostar Pen Inject 20 Units into the skin daily at 10 pm. Titrate up to max dose of 32 units daily as instructed. 5 pen 3  . Insulin Pen Needle (BD PEN NEEDLE NANO U/F) 32G X 4 MM MISC Use once daily for insulin admin 30 each 4  . levothyroxine (SYNTHROID, LEVOTHROID) 100 MCG tablet Take 1 tablet (100 mcg total) by mouth daily. (Patient taking differently: Take 100 mcg by mouth daily before breakfast. ) 90 tablet 1  . lisinopril (PRINIVIL,ZESTRIL) 10 MG tablet Take 1 tablet (10 mg total) by mouth daily. (Patient taking differently: Take 10 mg by mouth daily. AM) 90 tablet 2  . metFORMIN (GLUCOPHAGE) 1000 MG tablet Take 1 tablet (1,000 mg total) by mouth 2 (two) times daily with a meal. 180 tablet 2  . methocarbamol (ROBAXIN) 500 MG tablet Take 2 tablets by mouth three times a day. (Patient taking differently: every 8 (eight) hours as needed. Take 2 tablets by mouth three times a day.) 540 tablet 1  . Multiple Vitamins-Minerals (CENTRUM SILVER ADULT 50+ PO) Take 1 tablet by mouth daily. PM    . Na Sulfate-K Sulfate-Mg Sulf (SUPREP BOWEL PREP) SOLN Per instructions given at appt 1 Bottle 0  . Omega-3 Fatty Acids (FISH OIL) 300 MG CAPS Take 3 capsules by mouth daily. PM    . ondansetron (ZOFRAN) 4 MG tablet Take 1 tablet (4 mg total) by mouth as needed for nausea. 20 tablet 0  . ONETOUCH DELICA LANCETS 33G MISC Test sugars 2x daily 100  each 11  . QUEtiapine (SEROQUEL) 100 MG tablet Take 1 tablet (100 mg total) by mouth at bedtime. 90 tablet 2  . traZODone (DESYREL) 100 MG tablet Take 1 tablet (100 mg total) by mouth at bedtime. 90 tablet 2   No current facility-administered medications on file prior to visit.   No Known Allergies Family History  Problem Relation Age of Onset  . Diabetes Cousin   . Heart disease Other    PE: BP 114/68 mmHg  Pulse 87  Temp(Src) 98 F (36.7 C) (Oral)  Resp 12  Ht 5' 5.25" (1.657 m)  Wt 229 lb (103.874 kg)  BMI 37.83 kg/m2  SpO2 95% Wt Readings from Last 3 Encounters:  07/05/15 229 lb (103.874 kg)  06/03/15 217 lb (98.431 kg)  05/11/15 227 lb 8 oz (103.193 kg)   Constitutional: overweight, in NAD Eyes: PERRLA, EOMI, no exophthalmos ENT: moist mucous membranes, no thyromegaly, no cervical lymphadenopathy Cardiovascular: RRR, No MRG Respiratory: CTA B Gastrointestinal: abdomen soft, NT, ND, BS+ Musculoskeletal: no deformities, strength intact in all 4 Skin: moist, warm, no rashes Neurological: no tremor with outstretched hands, DTR normal in all 4  ASSESSMENT: 1. DM2, insulin-dependent, uncontrolled, without complications  2. Hypothyroidism  PLAN:  1. Patient with long-standing, uncontrolled diabetes, on metformin + basal-bolus insulin regimen, with improved control, but still many spikes in the 200-400s. HbA1c improving, but still high. - We discussed about options for treatment, and I suggested to add Invokana (or, if not approved by insurance, [place]) - she agrees:  Patient Instructions  Please continue: - Metformin 1000 mg 2x a day with meals - Lantus 20 units at bedtime - NovoLog 6 units 3x a day, before meals + SSI  Please start Invokana 100 in am.  Please return in 1.5 months with your sugar log.  - we discussed about SEs of Invokana, which are: dizziness (advised to be careful when stands from sitting position), decreased BP - usually not <  normal (BP  today is not low), and fungal UTIs (advised to let me know if develops one).  - given discount card for Invokana - Strongly advised her to start checking sugars at different times of the day - check 2-3 times a day, rotating checks - given sugar log and advised how to fill it and to bring it at next appt  - given foot care handout and explained the principles  - given instructions for hypoglycemia management "15-15 rule"  - advised for yearly eye exams >> she is UTD - Return to clinic in 1.5 mo with sugar log   2. Hypothyroidism - on LT4 100 mcg daily - advised her to Take the thyroid hormone every day, with water, >30 minutes before breakfast, separated by >4 hours from acid reflux medications, calcium, iron, multivitamins. She is now taking the LT4 along with PPI >> advised her to move PPI 4h later. - will recheck TFTs at next visit  - time spent with the patient:40 min, of which >50% was spent in obtaining information about her diabetes and hypothyroidism, reviewing her previous labs, evaluations, and treatments, counseling her about her conditions (please see the discussed topics above), and developing a plan to further treat them.

## 2015-07-09 ENCOUNTER — Ambulatory Visit (INDEPENDENT_AMBULATORY_CARE_PROVIDER_SITE_OTHER): Payer: BLUE CROSS/BLUE SHIELD | Admitting: Nurse Practitioner

## 2015-07-09 ENCOUNTER — Encounter: Payer: Self-pay | Admitting: Nurse Practitioner

## 2015-07-09 VITALS — BP 112/66 | HR 72 | Temp 98.3°F | Resp 16 | Ht 65.0 in | Wt 225.4 lb

## 2015-07-09 DIAGNOSIS — R300 Dysuria: Secondary | ICD-10-CM | POA: Diagnosis not present

## 2015-07-09 DIAGNOSIS — R21 Rash and other nonspecific skin eruption: Secondary | ICD-10-CM | POA: Diagnosis not present

## 2015-07-09 DIAGNOSIS — Z23 Encounter for immunization: Secondary | ICD-10-CM | POA: Diagnosis not present

## 2015-07-09 DIAGNOSIS — E669 Obesity, unspecified: Secondary | ICD-10-CM

## 2015-07-09 DIAGNOSIS — M6248 Contracture of muscle, other site: Secondary | ICD-10-CM | POA: Diagnosis not present

## 2015-07-09 DIAGNOSIS — M62838 Other muscle spasm: Secondary | ICD-10-CM

## 2015-07-09 DIAGNOSIS — Z87898 Personal history of other specified conditions: Secondary | ICD-10-CM

## 2015-07-09 DIAGNOSIS — F1911 Other psychoactive substance abuse, in remission: Secondary | ICD-10-CM

## 2015-07-09 MED ORDER — NYSTATIN 100000 UNIT/GM EX POWD: 1.0000 g | Freq: Two times a day (BID) | CUTANEOUS | Status: DC

## 2015-07-09 MED ORDER — IBUPROFEN 200 MG PO TABS: 600.0000 mg | ORAL_TABLET | Freq: Four times a day (QID) | ORAL | Status: DC | PRN

## 2015-07-09 NOTE — Progress Notes (Signed)
Patient ID: AIDYNN POLENDO, female    DOB: Jul 20, 1961  Age: 54 y.o. MRN: [id number]  CC: Follow-up   HPI Dawn Dean presents for a follow up with a CC of UTI.   1) Weight- Doing well with portion control and watching what foods she eats.    Wt Readings from Last 3 Encounters:  07/09/15 225 lb 6.4 oz (102.241 kg)  07/05/15 229 lb (103.874 kg)  06/03/15 217 lb (98.431 kg)   2) Dysuria- x 1 week, feels she needs to urinate, burns, does not feel she is emptying her bladder.   3) Muscle spasms improved- still would like refill of ibuprofen for aches  4) Needs pneumovax, discussed w/ pt at length  5) Oct. 9th 2016 1 year clean! She reports she feels good.   History Dawn Dean has a past medical history of Hypothyroidism; Anxiety; Mental disorder; GERD (gastroesophageal reflux disease); Headache(784.0); Arthritis; and Diabetes mellitus without complication.   She has past surgical history that includes Knee arthroscopy (Left); sinus surgery; Cesarean section; Dilation and curettage of uterus; Cervical discectomy (12/05/2012); Anterior cervical decomp/discectomy fusion (12/05/2012); Joint replacement (Left); and Colonoscopy with propofol ([name], 06/03/2015).   Her family history includes Diabetes in her cousin; Heart disease in her other.She reports that she quit smoking about 21 years ago. Her smoking use included Cigarettes. She has a 15 pack-year smoking history. She has never used smokeless tobacco. She reports that she does not drink alcohol or use illicit drugs.  Outpatient Prescriptions Prior to Visit  Medication Sig Dispense Refill  . atorvastatin (LIPITOR) 10 MG tablet TAKE 1 TABLET (10 MG TOTAL) BY MOUTH ONCE DAILY. 90 tablet 1  . citalopram (CELEXA) 20 MG tablet Take 2 tablets (40 mg total) by mouth at bedtime. Take 40 mg by mouth daily at bedtime 180 tablet 2  . CONTRAVE 8-90 MG TB12 2 tabs in AM and PM  0  . docusate sodium (COLACE) 100 MG capsule Take 1 capsule (100 mg  total) by mouth 3 (three) times daily as needed for constipation. 30 capsule 0  . empagliflozin (JARDIANCE) 25 MG TABS tablet Take 25 mg by mouth daily. 30 tablet 2  . esomeprazole (NEXIUM) 40 MG capsule Take 1 capsule (40 mg total) by mouth 2 (two) times daily with a meal. 180 capsule 2  . fluticasone (FLONASE) 50 MCG/ACT nasal spray Place 2 sprays into both nostrils daily. (Patient taking differently: Place 2 sprays into both nostrils as needed. ) 16 g 6  . gabapentin (NEURONTIN) 400 MG capsule TAKE 1 CAPSULE (400 MG TOTAL) BY MOUTH 3 (THREE) TIMES DAILY. 270 capsule 1  . glucose blood (ONE TOUCH ULTRA TEST) test strip Test sugars 4x daily 150 each 12  . insulin aspart (NOVOLOG FLEXPEN) 100 UNIT/ML FlexPen Inject [place] three times daily prior to meals Based on sliding scale between 6-16 units MDD, 60 units 15 mL 3  . Insulin Glargine (LANTUS SOLOSTAR) 100 UNIT/ML Solostar Pen Inject 20 Units into the skin daily at 10 pm. Titrate up to max dose of 32 units daily as instructed. 5 pen 3  . Insulin Pen Needle (BD PEN NEEDLE NANO U/F) 32G X 4 MM MISC Use once daily for insulin admin 30 each 4  . levothyroxine (SYNTHROID, LEVOTHROID) 100 MCG tablet Take 1 tablet (100 mcg total) by mouth daily. (Patient taking differently: Take 100 mcg by mouth daily before breakfast. ) 90 tablet 1  . lisinopril (PRINIVIL,ZESTRIL) 10 MG tablet Take 1 tablet (10 mg total) by  mouth daily. (Patient taking differently: Take 10 mg by mouth daily. AM) 90 tablet 2  . metFORMIN (GLUCOPHAGE) 1000 MG tablet Take 1 tablet (1,000 mg total) by mouth 2 (two) times daily with a meal. 180 tablet 2  . methocarbamol (ROBAXIN) 500 MG tablet Take 2 tablets by mouth three times a day. (Patient taking differently: every 8 (eight) hours as needed. Take 2 tablets by mouth three times a day.) 540 tablet 1  . Multiple Vitamins-Minerals (CENTRUM SILVER ADULT 50+ PO) Take 1 tablet by mouth daily. PM    . Na Sulfate-K Sulfate-Mg Sulf (SUPREP BOWEL PREP)  SOLN Per instructions given at appt 1 Bottle 0  . Omega-3 Fatty Acids (FISH OIL) 300 MG CAPS Take 3 capsules by mouth daily. PM    . ondansetron (ZOFRAN) 4 MG tablet Take 1 tablet (4 mg total) by mouth as needed for nausea. 20 tablet 0  . ONETOUCH DELICA LANCETS 33G MISC Test sugars 2x daily 100 each 11  . QUEtiapine (SEROQUEL) 100 MG tablet Take 1 tablet (100 mg total) by mouth at bedtime. 90 tablet 2  . traZODone (DESYREL) 100 MG tablet Take 1 tablet (100 mg total) by mouth at bedtime. 90 tablet 2  . traZODone (DESYREL) 50 MG tablet TAKE 1/2-1 TABLET BY MOUTH AT BEDTIME AS NEEDED FOR SLEEP. 30 tablet 2  . ibuprofen (ADVIL,MOTRIN) 200 MG tablet Take 3 tablets (600 mg total) by mouth every 6 (six) hours as needed. 180 tablet 2   No facility-administered medications prior to visit.    ROS Review of Systems  Constitutional: Negative for fever, chills, diaphoresis, activity change, appetite change, fatigue and unexpected weight change.  Respiratory: Negative for chest tightness, shortness of breath and wheezing.   Cardiovascular: Negative for chest pain, palpitations and leg swelling.  Gastrointestinal: Negative for nausea, vomiting and diarrhea.  Genitourinary: Positive for dysuria, frequency and decreased urine volume. Negative for urgency, flank pain, vaginal bleeding, vaginal discharge and vaginal pain.  Musculoskeletal: Positive for myalgias.  Skin: Positive for rash.  Neurological: Negative for dizziness, weakness, numbness and headaches.  Psychiatric/Behavioral: The patient is not nervous/anxious.     Objective:  BP 112/66 mmHg  Pulse 72  Temp(Src) 98.3 F (36.8 C)  Resp 16  Ht  (1.651 m)  Wt 225 lb 6.4 oz (102.241 kg)  BMI 37.51 kg/m2  SpO2 96%  Physical Exam  Constitutional: She is oriented to person, place, and time. She appears well-developed and well-nourished. No distress.  HENT:  Head: Normocephalic and atraumatic.  Right Ear: External ear normal.  Left Ear:  External ear normal.  Cardiovascular: Normal rate, regular rhythm, normal heart sounds and intact distal pulses.  Exam reveals no gallop and no friction rub.   No murmur heard. Pulmonary/Chest: Effort normal and breath sounds normal. No respiratory distress. She has no wheezes. She has no rales. She exhibits no tenderness.  Neurological: She is alert and oriented to person, place, and time. No cranial nerve deficit. She exhibits normal muscle tone. Coordination normal.  Skin: Skin is warm and dry. No rash noted. She is not diaphoretic.  Psychiatric: She has a normal mood and affect. Her behavior is normal. Judgment and thought content normal.   Assessment & Plan:   Kaylean was seen today for follow-up.  Diagnoses and all orders for this visit:  Dysuria -     POCT Urinalysis Dipstick  Rash and nonspecific skin eruption  Muscle spasms of neck  Obesity (BMI 30-39.9)  Need for prophylactic vaccination  against Streptococcus pneumoniae (pneumococcus) -     Pneumococcal polysaccharide vaccine 23-valent greater than or equal to 2yo subcutaneous/IM  Hx of substance abuse  Other orders -     nystatin (MYCOSTATIN/NYSTOP) 100000 UNIT/GM POWD; Apply 1 g topically 2 (two) times daily. -     ibuprofen (ADVIL,MOTRIN) 200 MG tablet; Take 3 tablets (600 mg total) by mouth every 6 (six) hours as needed.  I am having Ms. Harker start on nystatin. I am also having her maintain her Multiple Vitamins-Minerals (CENTRUM SILVER ADULT 50+ PO), docusate sodium, Fish Oil, ONETOUCH DELICA LANCETS 33G, traZODone, QUEtiapine, metFORMIN, lisinopril, esomeprazole, citalopram, fluticasone, gabapentin, Insulin Pen Needle, insulin aspart, methocarbamol, ondansetron, glucose blood, CONTRAVE, Na Sulfate-K Sulfate-Mg Sulf, levothyroxine, atorvastatin, Insulin Glargine, traZODone, empagliflozin, and ibuprofen.  Meds ordered this encounter  Medications  . nystatin (MYCOSTATIN/NYSTOP) 100000 UNIT/GM POWD    Sig: Apply 1  g topically 2 (two) times daily.    Dispense:  60 g    Refill:  1    Order Specific Question:  Supervising Provider    Answer:  Darrick Huntsman, TERESA L [[id number]]  . ibuprofen (ADVIL,MOTRIN) 200 MG tablet    Sig: Take 3 tablets (600 mg total) by mouth every 6 (six) hours as needed.    Dispense:  180 tablet    Refill:  2    Order Specific Question:  Supervising Provider    Answer:  Sherlene Shams [[id number]]     Follow-up: Return in about 3 months (around 10/09/2015).

## 2015-07-09 NOTE — Progress Notes (Signed)
Pre visit review using our clinic review tool, if applicable. No additional management support is needed unless otherwise documented below in the visit note. 

## 2015-07-09 NOTE — Assessment & Plan Note (Signed)
Pt continues to have yeast under breasts and in inguinal folds. Sent into pt pharmacy nystatin powder. Will follow.

## 2015-07-09 NOTE — Assessment & Plan Note (Signed)
Wt Readings from Last 3 Encounters:  07/09/15 225 lb 6.4 oz (102.241 kg)  07/05/15 229 lb (103.874 kg)  06/03/15 217 lb (98.431 kg)   Pt is frustrated, but she was congratulated for good work with portion control and watching what she eats.

## 2015-07-09 NOTE — Assessment & Plan Note (Signed)
Oct. 9th 2016 will be a year clean. She is excited and feels improved in mental clarity.

## 2015-07-09 NOTE — Patient Instructions (Signed)
Please call Dr. Charlean Sanfilippo office for 90 day supply of Lantus.

## 2015-07-09 NOTE — Assessment & Plan Note (Signed)
Pt could not urinate after two glasses of water today. Asked pt if she could sit in the lab for awhile, she has another appointment to get to and will see UC this weekend she reports.

## 2015-07-09 NOTE — Assessment & Plan Note (Signed)
Improved. No complaints, pt would like refill of ibuprofen .

## 2015-07-12 ENCOUNTER — Other Ambulatory Visit: Payer: Self-pay | Admitting: *Deleted

## 2015-07-12 DIAGNOSIS — E039 Hypothyroidism, unspecified: Secondary | ICD-10-CM

## 2015-07-12 MED ORDER — LEVOTHYROXINE SODIUM 100 MCG PO TABS: 100.0000 ug | ORAL_TABLET | Freq: Every day | ORAL | Status: DC

## 2015-08-19 ENCOUNTER — Encounter: Payer: Self-pay | Admitting: Nurse Practitioner

## 2015-08-20 ENCOUNTER — Ambulatory Visit: Payer: Self-pay | Admitting: Internal Medicine

## 2015-08-27 ENCOUNTER — Encounter: Payer: Self-pay | Admitting: Nurse Practitioner

## 2015-08-27 ENCOUNTER — Ambulatory Visit (INDEPENDENT_AMBULATORY_CARE_PROVIDER_SITE_OTHER): Payer: BLUE CROSS/BLUE SHIELD | Admitting: Nurse Practitioner

## 2015-08-27 ENCOUNTER — Other Ambulatory Visit (HOSPITAL_COMMUNITY)
Admission: RE | Admit: 2015-08-27 | Discharge: 2015-08-27 | Disposition: A | Discharge status: Home / Self Care | Payer: BLUE CROSS/BLUE SHIELD | Source: Ambulatory Visit | Attending: Nurse Practitioner | Admitting: Nurse Practitioner

## 2015-08-27 VITALS — BP 108/60 | HR 76 | Temp 98.4°F | Resp 16 | Ht 65.0 in | Wt 237.0 lb

## 2015-08-27 DIAGNOSIS — Z0000 Encounter for general adult medical examination without abnormal findings: Secondary | ICD-10-CM

## 2015-08-27 DIAGNOSIS — Z1151 Encounter for screening for human papillomavirus (HPV): Secondary | ICD-10-CM | POA: Insufficient documentation

## 2015-08-27 DIAGNOSIS — Z01419 Encounter for gynecological examination (general) (routine) without abnormal findings: Secondary | ICD-10-CM | POA: Insufficient documentation

## 2015-08-27 DIAGNOSIS — Z124 Encounter for screening for malignant neoplasm of cervix: Secondary | ICD-10-CM | POA: Diagnosis not present

## 2015-08-27 DIAGNOSIS — Z1239 Encounter for other screening for malignant neoplasm of breast: Secondary | ICD-10-CM

## 2015-08-27 NOTE — Progress Notes (Signed)
Patient ID: Dawn Dean, female    DOB: May 25, 1961  Age: 54 y.o. MRN: [id number]  CC: Annual Exam   HPI Dawn Dean presents for Annual Exam.   1) Health Maintenance-   Diet- No formal   Exercise- No formal  Immunizations- Flu vaccine today   Mammogram- Needs  Pap- Doing today  Colonoscopy- 2016   Eye Exam- 2016   Dental Exam- UTD  LMP- Post-menopausal  History Dawn Dean has a past medical history of Hypothyroidism; Anxiety; Mental disorder; GERD (gastroesophageal reflux disease); Headache(784.0); Arthritis; and Diabetes mellitus without complication.   Dawn Dean has past surgical history that includes Knee arthroscopy (Left); sinus surgery; Cesarean section; Dilation and curettage of uterus; Cervical discectomy (12/05/2012); Anterior cervical decomp/discectomy fusion (12/05/2012); Joint replacement (Left); and Colonoscopy with propofol ([name], 06/03/2015).   Her family history includes Diabetes in her cousin; Heart disease in her other.Dawn Dean reports that Dawn Dean quit smoking about 21 years ago. Her smoking use included Cigarettes. Dawn Dean has a 15 pack-year smoking history. Dawn Dean has never used smokeless tobacco. Dawn Dean reports that Dawn Dean does not drink alcohol or use illicit drugs.  Outpatient Prescriptions Prior to Visit  Medication Sig Dispense Refill  . atorvastatin (LIPITOR) 10 MG tablet TAKE 1 TABLET (10 MG TOTAL) BY MOUTH ONCE DAILY. 90 tablet 1  . citalopram (CELEXA) 20 MG tablet Take 2 tablets (40 mg total) by mouth at bedtime. Take 40 mg by mouth daily at bedtime 180 tablet 2  . CONTRAVE 8-90 MG TB12 2 tabs in AM and PM  0  . docusate sodium (COLACE) 100 MG capsule Take 1 capsule (100 mg total) by mouth 3 (three) times daily as needed for constipation. 30 capsule 0  . empagliflozin (JARDIANCE) 25 MG TABS tablet Take 25 mg by mouth daily. 30 tablet 2  . esomeprazole (NEXIUM) 40 MG capsule Take 1 capsule (40 mg total) by mouth 2 (two) times daily with a meal. 180 capsule 2  . fluticasone  (FLONASE) 50 MCG/ACT nasal spray Place 2 sprays into both nostrils daily. (Patient taking differently: Place 2 sprays into both nostrils as needed. ) 16 g 6  . gabapentin (NEURONTIN) 400 MG capsule TAKE 1 CAPSULE (400 MG TOTAL) BY MOUTH 3 (THREE) TIMES DAILY. 270 capsule 1  . glucose blood (ONE TOUCH ULTRA TEST) test strip Test sugars 4x daily 150 each 12  . ibuprofen (ADVIL,MOTRIN) 200 MG tablet Take 3 tablets (600 mg total) by mouth every 6 (six) hours as needed. 180 tablet 2  . insulin aspart (NOVOLOG FLEXPEN) 100 UNIT/ML FlexPen Inject [place] three times daily prior to meals Based on sliding scale between 6-16 units MDD, 60 units 15 mL 3  . Insulin Glargine (LANTUS SOLOSTAR) 100 UNIT/ML Solostar Pen Inject 20 Units into the skin daily at 10 pm. Titrate up to max dose of 32 units daily as instructed. 5 pen 3  . Insulin Pen Needle (BD PEN NEEDLE NANO U/F) 32G X 4 MM MISC Use once daily for insulin admin 30 each 4  . levothyroxine (SYNTHROID, LEVOTHROID) 100 MCG tablet Take 1 tablet (100 mcg total) by mouth daily before breakfast. 90 tablet 0  . lisinopril (PRINIVIL,ZESTRIL) 10 MG tablet Take 1 tablet (10 mg total) by mouth daily. (Patient taking differently: Take 10 mg by mouth daily. AM) 90 tablet 2  . metFORMIN (GLUCOPHAGE) 1000 MG tablet Take 1 tablet (1,000 mg total) by mouth 2 (two) times daily with a meal. 180 tablet 2  . methocarbamol (ROBAXIN) 500 MG tablet Take 2  tablets by mouth three times a day. (Patient taking differently: every 8 (eight) hours as needed. Take 2 tablets by mouth three times a day.) 540 tablet 1  . Multiple Vitamins-Minerals (CENTRUM SILVER ADULT 50+ PO) Take 1 tablet by mouth daily. PM    . Na Sulfate-K Sulfate-Mg Sulf (SUPREP BOWEL PREP) SOLN Per instructions given at appt 1 Bottle 0  . nystatin (MYCOSTATIN/NYSTOP) 100000 UNIT/GM POWD Apply 1 g topically 2 (two) times daily. 60 g 1  . Omega-3 Fatty Acids (FISH OIL) 300 MG CAPS Take 3 capsules by mouth daily. PM    .  ondansetron (ZOFRAN) 4 MG tablet Take 1 tablet (4 mg total) by mouth as needed for nausea. 20 tablet 0  . ONETOUCH DELICA LANCETS 33G MISC Test sugars 2x daily 100 each 11  . QUEtiapine (SEROQUEL) 100 MG tablet Take 1 tablet (100 mg total) by mouth at bedtime. 90 tablet 2  . traZODone (DESYREL) 100 MG tablet Take 1 tablet (100 mg total) by mouth at bedtime. 90 tablet 2  . traZODone (DESYREL) 50 MG tablet TAKE 1/2-1 TABLET BY MOUTH AT BEDTIME AS NEEDED FOR SLEEP. 30 tablet 2   No facility-administered medications prior to visit.    ROS Review of Systems  Constitutional: Negative for fever, chills, diaphoresis and fatigue.  HENT: Negative for tinnitus and trouble swallowing.   Eyes: Negative for visual disturbance.  Respiratory: Negative for chest tightness, shortness of breath and wheezing.   Cardiovascular: Negative for chest pain, palpitations and leg swelling.  Gastrointestinal: Negative for nausea, vomiting, diarrhea and constipation.  Genitourinary: Negative for difficulty urinating.  Musculoskeletal: Positive for back pain. Negative for neck pain.  Skin: Negative for rash.  Neurological: Negative for dizziness, weakness, numbness and headaches.  Psychiatric/Behavioral: Negative for suicidal ideas and sleep disturbance. The patient is not nervous/anxious.     Objective:  BP 108/60 mmHg  Pulse 76  Temp(Src) 98.4 F (36.9 C)  Resp 16  Ht  (1.651 m)  Wt 237 lb (107.502 kg)  BMI 39.44 kg/m2  SpO2 95%  Physical Exam  Constitutional: Dawn Dean is oriented to person, place, and time. Dawn Dean appears well-developed and well-nourished. No distress.  Poor hygiene  HENT:  Head: Normocephalic and atraumatic.  Right Ear: External ear normal.  Left Ear: External ear normal.  Nose: Nose normal.  Mouth/Throat: Oropharynx is clear and moist. No oropharyngeal exudate.  TMs and canals clear bilaterally  Eyes: Conjunctivae and EOM are normal. Pupils are equal, round, and reactive to light.  Right eye exhibits no discharge. Left eye exhibits no discharge. No scleral icterus.  Neck: Normal range of motion. Neck supple. No thyromegaly present.  Cardiovascular: Normal rate, regular rhythm and normal heart sounds.  Exam reveals no gallop and no friction rub.   No murmur heard. Pulmonary/Chest: Effort normal and breath sounds normal. No respiratory distress. Dawn Dean has no wheezes. Dawn Dean has no rales. Dawn Dean exhibits no tenderness.  Clinical breast exam normal  Abdominal: Soft. Bowel sounds are normal. Dawn Dean exhibits no distension and no mass. There is no tenderness. There is no rebound and no guarding.  Obese, well healed scar  Genitourinary: Vagina normal. No vaginal discharge found.  Musculoskeletal: Normal range of motion. Dawn Dean exhibits no edema or tenderness.  Lymphadenopathy:    Dawn Dean has no cervical adenopathy.  Neurological: Dawn Dean is alert and oriented to person, place, and time. Dawn Dean has normal reflexes. No cranial nerve deficit. Dawn Dean exhibits normal muscle tone. Coordination normal.  Skin: Skin is warm and dry.  No rash noted. Dawn Dean is not diaphoretic. No erythema. No pallor.  Psychiatric: Dawn Dean has a normal mood and affect. Her behavior is normal. Judgment and thought content normal.   Assessment & Plan:   There are no diagnoses linked to this encounter. I am having Dawn Dean maintain her Multiple Vitamins-Minerals (CENTRUM SILVER ADULT 50+ PO), docusate sodium, FISH OIL, ONETOUCH DELICA LANCETS 33G, traZODone, QUEtiapine, metFORMIN, lisinopril, esomeprazole, citalopram, fluticasone, gabapentin, Insulin Pen Needle, insulin aspart, methocarbamol, ondansetron, glucose blood, CONTRAVE, Na Sulfate-K Sulfate-Mg Sulf, atorvastatin, Insulin Glargine, traZODone, empagliflozin, nystatin, ibuprofen, and levothyroxine.  No orders of the defined types were placed in this encounter.     Follow-up: Return in about 1 year (around 08/26/2016) for CPE.

## 2015-08-27 NOTE — Patient Instructions (Signed)

## 2015-08-27 NOTE — Progress Notes (Signed)
Pre visit review using our clinic review tool, if applicable. No additional management support is needed unless otherwise documented below in the visit note. 

## 2015-08-27 NOTE — Assessment & Plan Note (Signed)
Discussed acute and chronic issues. Reviewed health maintenance measures, PFSHx, and immunizations. Labs were done this year at different points and up to date.   Ordered Mammogram for [facility] Flu vaccine given today PAP and clinical breast exam done today

## 2015-08-27 NOTE — Addendum Note (Signed)
Addended by: Warden Fillers on: 08/27/2015 02:45 PM   Modules accepted: Orders, SmartSet

## 2015-08-30 ENCOUNTER — Other Ambulatory Visit: Payer: Self-pay | Admitting: Endocrinology

## 2015-08-31 LAB — CYTOLOGY - PAP

## 2015-09-01 ENCOUNTER — Other Ambulatory Visit: Payer: Self-pay | Admitting: *Deleted

## 2015-09-01 MED ORDER — INSULIN ASPART 100 UNIT/ML FLEXPEN: PEN_INJECTOR | SUBCUTANEOUS | Status: DC

## 2015-09-03 ENCOUNTER — Telehealth: Payer: Self-pay | Admitting: Nurse Practitioner

## 2015-09-03 ENCOUNTER — Ambulatory Visit: Payer: Self-pay | Admitting: Nurse Practitioner

## 2015-09-03 DIAGNOSIS — Z0289 Encounter for other administrative examinations: Secondary | ICD-10-CM

## 2015-09-03 NOTE — Telephone Encounter (Signed)
Pt is getting scheduled today

## 2015-09-03 NOTE — Telephone Encounter (Signed)
Pt called wanting to know if she needs a Tetnus shot she had to save a her dog and jumped into [facility]. Pt states the cut on her left foot feels weird. Pt has cleaned it and it is sore and look like a red streak. Thank You!

## 2015-09-08 ENCOUNTER — Other Ambulatory Visit: Payer: Self-pay | Admitting: Nurse Practitioner

## 2015-09-20 ENCOUNTER — Ambulatory Visit: Payer: Self-pay | Admitting: Internal Medicine

## 2015-09-20 ENCOUNTER — Encounter: Payer: Self-pay | Admitting: *Deleted

## 2015-09-20 DIAGNOSIS — Z0289 Encounter for other administrative examinations: Secondary | ICD-10-CM

## 2015-10-03 ENCOUNTER — Other Ambulatory Visit: Payer: Self-pay | Admitting: Nurse Practitioner

## 2015-10-04 ENCOUNTER — Other Ambulatory Visit: Payer: Self-pay | Admitting: Nurse Practitioner

## 2015-10-05 NOTE — Telephone Encounter (Signed)
Please advise refill, patient was a no show for last visit.

## 2015-10-10 ENCOUNTER — Other Ambulatory Visit: Payer: Self-pay | Admitting: Nurse Practitioner

## 2015-10-14 ENCOUNTER — Other Ambulatory Visit: Payer: Self-pay | Admitting: Nurse Practitioner

## 2015-11-17 ENCOUNTER — Other Ambulatory Visit: Payer: Self-pay | Admitting: Internal Medicine

## 2015-11-17 ENCOUNTER — Other Ambulatory Visit: Payer: Self-pay | Admitting: Nurse Practitioner

## 2015-11-26 ENCOUNTER — Other Ambulatory Visit: Payer: Self-pay | Admitting: Internal Medicine

## 2015-12-05 ENCOUNTER — Other Ambulatory Visit: Payer: Self-pay | Admitting: Internal Medicine

## 2015-12-06 ENCOUNTER — Other Ambulatory Visit: Payer: Self-pay | Admitting: Nurse Practitioner

## 2015-12-06 MED ORDER — INSULIN DETEMIR 100 UNIT/ML FLEXPEN: PEN_INJECTOR | SUBCUTANEOUS | Status: DC

## 2015-12-14 ENCOUNTER — Encounter: Payer: Self-pay | Admitting: Internal Medicine

## 2015-12-14 ENCOUNTER — Ambulatory Visit (INDEPENDENT_AMBULATORY_CARE_PROVIDER_SITE_OTHER): Payer: BLUE CROSS/BLUE SHIELD | Admitting: Internal Medicine

## 2015-12-14 ENCOUNTER — Ambulatory Visit: Payer: Self-pay | Admitting: Internal Medicine

## 2015-12-14 VITALS — BP 114/62 | HR 87 | Temp 97.7°F | Resp 12 | Wt 243.8 lb

## 2015-12-14 DIAGNOSIS — E1165 Type 2 diabetes mellitus with hyperglycemia: Secondary | ICD-10-CM

## 2015-12-14 DIAGNOSIS — Z794 Long term (current) use of insulin: Secondary | ICD-10-CM

## 2015-12-14 DIAGNOSIS — E039 Hypothyroidism, unspecified: Secondary | ICD-10-CM | POA: Diagnosis not present

## 2015-12-14 LAB — BASIC METABOLIC PANEL
BUN: 21 mg/dL (ref 6–23)
CALCIUM: 9.5 mg/dL (ref 8.4–10.5)
CO2: 27 meq/L (ref 19–32)
Chloride: 102 mEq/L (ref 96–112)
Creatinine, Ser: 1.01 mg/dL (ref 0.40–1.20)
GFR: 60.16 mL/min (ref >60.00)
GLUCOSE: 156 mg/dL — AB (ref 70–99)
Potassium: 4.3 mEq/L (ref 3.5–5.1)
SODIUM: 137 meq/L (ref 135–145)

## 2015-12-14 LAB — HEMOGLOBIN A1C: Hgb A1c MFr Bld: 9.4 % — ABNORMAL HIGH (ref 4.6–6.5)

## 2015-12-14 LAB — HM DIABETES FOOT EXAM: HM Diabetic Foot Exam: NORMAL

## 2015-12-14 LAB — T4, FREE: Free T4: 0.74 ng/dL (ref 0.60–1.60)

## 2015-12-14 LAB — TSH: TSH: 5.39 u[IU]/mL — ABNORMAL HIGH (ref 0.35–4.50)

## 2015-12-14 MED ORDER — DAPAGLIFLOZIN PROPANEDIOL 10 MG PO TABS: 10.0000 mg | ORAL_TABLET | Freq: Every day | ORAL | Status: DC

## 2015-12-14 MED ORDER — INSULIN GLARGINE 100 UNIT/ML SOLOSTAR PEN: 35.0000 [IU] | PEN_INJECTOR | Freq: Every day | SUBCUTANEOUS | Status: DC

## 2015-12-14 NOTE — Patient Instructions (Addendum)
Please increase Lantus to 35 units at bedtime. Decrease Novolog to: - 14 units for a smaller meal - 16 units for a larger meal  Continue: - Metformin 1000 mg 2x a day  Stop Invokana and switch to: - Farxiga 10 mg daily in am.  Please stop at the lab.  Please return in 1.5 months with your sugar log.   Please consider the following ways to cut down carbs and fat and increase fiber and micronutrients in your diet: - substitute whole grain for white bread or pasta - substitute brown rice for white rice - substitute 90-calorie flatbread pieces for slices of bread when possible - substitute sweet potatoes or yams for white potatoes - substitute humus for margarine - substitute tofu for cheese when possible - substitute almond or rice milk for regular milk - substitute dark chocolate for other sweets when possible - substitute water - can add lemon/orange/lime/kiwi slices for taste - for diet sodas (artificial sweeteners will trick your body that you can eat sweets without getting calories and will lead you to overeating and weight gain in the long run) - do not skip breakfast or other meals (this will slow down the metabolism and will result in more weight gain over time)  - can try smoothies made from fruit and almond/rice milk in am instead of regular breakfast - can also try old-fashioned (not instant) oatmeal made with almond/rice milk in am - order the dressing on the side when eating salad at a restaurant (pour less than half of the dressing on the salad) - eat as little meat as possible  - can try juicing, but should not forget that juicing will get rid of the fiber, so would alternate with eating raw veg./fruits or drinking smoothies - use as little oil as possible, even when using olive oil - can dress a salad with a mix of balsamic vinegar and lemon juice, for e.g. - use agave nectar, stevia sugar, or regular sugar rather than artificial sweateners - steam or broil/roast veggies   - snack on veggies/fruit/nuts (unsalted, preferably) when possible, rather than processed foods - reduce or eliminate aspartame in diet (it is in diet sodas, chewing gum, etc) Read the labels!  Try to read Dr. Katherina Right book: "Program for Reversing Diabetes" for the vegan concept and other ideas for healthy eating.

## 2015-12-14 NOTE — Progress Notes (Addendum)
Patient ID: PASCALE Dean, female   DOB: 1961-01-08, 55 y.o.   MRN: [id number]  HPI: Dawn Dean is a 55 y.o.-year-old female, returning for f/u for DM2, dx in ~1995 PCOS before), insulin-dependent since 01/2015, uncontrolled, without complications and hypothyroidism. Last visit 5 mo ago.  She had many dietary indiscretions over the Holidays >> gained weight.   DM2: Last hemoglobin A1c was: Lab Results  Component Value Date   HGBA1C 9.2* 05/11/2015   HGBA1C 10.2* 01/26/2015   HGBA1C 7.5* 09/28/2014   Pt is on a regimen of: - Metformin 1000 mg 2x a day with meals - Invokana 100 in am, before b'fast (added 06/2015) - Lantus 20 >> 30 units at bedtime - NovoLog 16-20 units 3x a day, before meals (+ SSI)  Per review of Dr Ephriam Jenkins note from 04/2015: Tried  Metformin, Invokana, Bydureon, Lantus, Glipizide/Glimepiride/Glyburide, Actos/Avandia, Bydureon/Tanzeum in the past.  * Didn't tolerate Tanzeum due to GI side effects (nausea, gastritis). *taken off Lantus and Bydureon by [facility] due to lows about Sept 2015- Invokana dose reduced from 300 mg to 100 mg around that time * Invokana 100 mg daily taken off due to insurance jan 2016>>switched to [place] * taken off Farxiga as GFR on the lower side at 57 in March 2016 and started on Lantus.   Pt checks her sugars 3x a day and they are better in the last week - diet: - am: 76, 80-160 >> 117-130 - 2h after b'fast: 112-291 >> n/c - before lunch: 108, 114-225, 315, 429 (party) >> 140-150 - 2h after lunch: 316 >> n/c - before dinner: 95-159, 296 >> n/c - 2h after dinner: 101-193 >> 97-150 - bedtime: 97-199, 273 >> n/c - nighttime: n/c + lows. Lowest sugar was 90s; she has hypoglycemia awareness at 70.  Highest sugar was 200s >> 400s before starting her diet.  Glucometer: One Touch Ultra Mini  Pt's meals are: - Breakfast: Yoghurt +/- 1/2 banana; eggs + toast or grits; oatmeal - Lunch: may skip; [place] sandwich (1/2) +/-  a salad; sometimes soup (Progressive) - Dinner: chicken breast + steamed veggies + bread or pasta - Snacks: usually no; sometimes popcorn; PB sandwich  - no CKD, last BUN/creatinine:  Lab Results  Component Value Date   BUN 25* 05/11/2015   CREATININE 0.95 05/11/2015  On Lisinopril. - last set of lipids: Lab Results  Component Value Date   CHOL 169 05/11/2015   HDL 32.70* 05/11/2015   LDLCALC 89 05/19/2014   LDLDIRECT 88.0 05/11/2015   TRIG 329.0* 05/11/2015   CHOLHDL 5 05/11/2015  On Lipitor. - last eye exam was in 05/2015. No DR.  - no numbness and tingling in her feet.  On Neurontin 400 mg 3x a day (for anxiety). She sees a psychiatrist.  She also has hypothyroidism:  She is on LT4 100 mcg daily, with normal TFTs at last checks: Lab Results  Component Value Date   TSH 2.20 04/02/2015   TSH 2.29 01/26/2015   TSH 5.83 09/28/2014   TSH 0.90 05/19/2014   TSH 1.51 08/28/2013   FREET4 0.76 04/02/2015   FREET4 0.88 01/26/2015   She is taking the LT4 correctly: - in am - fasting - eats b'fast >30 min later - no Ca, Fe - on Nexium lunch and dinner - + MVI in HS  She has a h/o substance abuse.  ROS: Constitutional: + weight gain, no fatigue, no subjective hyperthermia/hypothermia Eyes: no blurry vision, no xerophthalmia ENT: no sore throat,  no nodules palpated in throat, no dysphagia/odynophagia, no hoarseness Cardiovascular: no CP/SOB/palpitations/leg swelling Respiratory: no cough/SOB Gastrointestinal: no N/V/D/C Musculoskeletal: no muscle/joint aches Skin: no rashes Neurological: no tremors/numbness/tingling/dizziness Foot exam: sensation intact to light touch, no lesions or deformities. Good pedal pulses. + B swelling dorsum of feet.  I reviewed pt's medications, allergies, PMH, social hx, family hx, and changes were documented in the history of present illness. Otherwise, unchanged from my initial visit note:  Past Medical History  Diagnosis Date  .  Hypothyroidism   . Anxiety   . Mental disorder   . GERD (gastroesophageal reflux disease)   . Headache(784.0)   . Arthritis     NECK AND KNEE  . Diabetes mellitus without complication (HCC)     TYPE II /diagnosed -20 yrs. ago    Past Surgical History  Procedure Laterality Date  . Knee arthroscopy Left   . Sinus surgery      for deviated septum  . Cesarean section      1985  . Dilation and curettage of uterus    . Cervical discectomy  12/05/2012  . Anterior cervical decomp/discectomy fusion  12/05/2012    Procedure: ANTERIOR CERVICAL DECOMPRESSION/DISCECTOMY FUSION 1 LEVEL;  Surgeon: Venita Lick, MD;  Location: [facility] OR;  Service: Orthopedics;  Laterality: [name];  ACDF C5-6  . Joint replacement Left     APPROX 7 OR 8 YRS AGO  . Colonoscopy with propofol [name] 06/03/2015    Procedure: COLONOSCOPY WITH PROPOFOL;  Surgeon: Midge Minium, MD;  Location: [facility];  Service: Endoscopy;  Laterality: [name];   History   Social History Main Topics  . Smoking status: Former Smoker -- 1.50 packs/day for 10 years    Types: Cigarettes    Quit date: 12/05/1993  . Smokeless tobacco: Never Used  . Alcohol Use: No     Comment: very seldom   . Drug Use: No   Social History Narrative   Married 30 years to husband   Living together in [place]   1 son 75 yo   2 dogs- taz and oreo   1 cat- pumpkin   Garment/textile technologist    Currently not working, will start looking for work early 2016    - On the Conservation officer, nature for [employer] Dependency    Current Outpatient Prescriptions on File Prior to Visit  Medication Sig Dispense Refill  . atorvastatin (LIPITOR) 10 MG tablet TAKE 1 TABLET (10 MG TOTAL) BY MOUTH ONCE DAILY. 90 tablet 1  . citalopram (CELEXA) 20 MG tablet Take 2 tablets (40 mg total) by mouth at bedtime. Take 40 mg by mouth daily at bedtime 180 tablet 2  . docusate sodium (COLACE) 100 MG capsule Take 1 capsule (100 mg total) by mouth 3 (three) times daily as needed for constipation. 30  capsule 0  . empagliflozin (JARDIANCE) 25 MG TABS tablet Take 25 mg by mouth daily. 30 tablet 2  . esomeprazole (NEXIUM) 40 MG capsule Take 1 capsule (40 mg total) by mouth 2 (two) times daily with a meal. 180 capsule 2  . fluticasone (FLONASE) 50 MCG/ACT nasal spray Place 2 sprays into both nostrils daily. (Patient taking differently: Place 2 sprays into both nostrils as needed. ) 16 g 6  . gabapentin (NEURONTIN) 400 MG capsule TAKE 1 CAPSULE (400 MG TOTAL) BY MOUTH 3 (THREE) TIMES DAILY. 270 capsule 1  . glucose blood (ONE TOUCH ULTRA TEST) test strip Test sugars 4x daily 150 each 12  . ibuprofen (ADVIL,MOTRIN) 200 MG  tablet Take 3 tablets (600 mg total) by mouth every 6 (six) hours as needed. 180 tablet 2  . Insulin Pen Needle (BD PEN NEEDLE NANO U/F) 32G X 4 MM MISC Use once daily for insulin admin 30 each 4  . levothyroxine (SYNTHROID, LEVOTHROID) 100 MCG tablet TAKE 1 TABLET (100 MCG TOTAL) BY MOUTH DAILY BEFORE BREAKFAST. 90 tablet 0  . lisinopril (PRINIVIL,ZESTRIL) 10 MG tablet TAKE 1 TABLET (10 MG TOTAL) BY MOUTH DAILY. 90 tablet 1  . metFORMIN (GLUCOPHAGE) 1000 MG tablet TAKE 1 TABLET (1,000 MG TOTAL) BY MOUTH 2 (TWO) TIMES DAILY WITH A MEAL. 180 tablet 2  . methocarbamol (ROBAXIN) 500 MG tablet Take 2 tablets by mouth three times a day. (Patient taking differently: every 8 (eight) hours as needed. Take 2 tablets by mouth three times a day.) 540 tablet 1  . Multiple Vitamins-Minerals (CENTRUM SILVER ADULT 50+ PO) Take 1 tablet by mouth daily. PM    . Na Sulfate-K Sulfate-Mg Sulf (SUPREP BOWEL PREP) SOLN Per instructions given at appt 1 Bottle 0  . NOVOLOG FLEXPEN 100 UNIT/ML FlexPen INJECT [place] THREE TIMES DAILY PRIOR TO MEALS BASED ON SLIDING SCALE BETWEEN 6-16 UNITS MDD, 60 UNITS 15 mL 1  . nystatin (MYCOSTATIN/NYSTOP) 100000 UNIT/GM POWD Apply 1 g topically 2 (two) times daily. 60 g 1  . Omega-3 Fatty Acids (FISH OIL) 300 MG CAPS Take 3 capsules by mouth daily. PM    . ondansetron  (ZOFRAN) 4 MG tablet Take 1 tablet (4 mg total) by mouth as needed for nausea. 20 tablet 0  . ONETOUCH DELICA LANCETS 33G MISC Test sugars 2x daily 100 each 11  . QUEtiapine (SEROQUEL) 100 MG tablet TAKE 1 TABLET (100 MG TOTAL) BY MOUTH AT BEDTIME. 90 tablet 2  . traZODone (DESYREL) 100 MG tablet TAKE 1 TABLET (100 MG TOTAL) BY MOUTH AT BEDTIME. 90 tablet 2  . traZODone (DESYREL) 50 MG tablet TAKE 1/2-1 TABLET BY MOUTH AT BEDTIME AS NEEDED FOR SLEEP. 30 tablet 2   No current facility-administered medications on file prior to visit.   No Known Allergies Family History  Problem Relation Age of Onset  . Diabetes Cousin   . Heart disease Other    PE: BP 114/62 mmHg  Pulse 87  Temp(Src) 97.7 F (36.5 C) (Oral)  Resp 12  Wt 243 lb 12.8 oz (110.587 kg)  SpO2 96% Wt Readings from Last 3 Encounters:  12/14/15 243 lb 12.8 oz (110.587 kg)  08/27/15 237 lb (107.502 kg)  07/09/15 225 lb 6.4 oz (102.241 kg)   Constitutional: overweight, in NAD Eyes: PERRLA, EOMI, no exophthalmos ENT: moist mucous membranes, no thyromegaly, no cervical lymphadenopathy Cardiovascular: RRR, No MRG Respiratory: CTA B Gastrointestinal: abdomen soft, NT, ND, BS+ Musculoskeletal: no deformities, strength intact in all 4 Skin: moist, warm, no rashes Neurological: no tremor with outstretched hands, DTR normal in all 4  ASSESSMENT: 1. DM2, insulin-dependent, uncontrolled, without complications  2. Hypothyroidism  PLAN:  1. Patient with long-standing, uncontrolled diabetes, on metformin + basal-bolus insulin regimen, with improved control in the last week, but poor control before, over the Holidays. Will increase Lantus and decrease Mealtime insulin. - will also need to switch to another DPP4 inh b/c insurance coverage  Patient Instructions  Please increase Lantus to 35 units at bedtime. Decrease Novolog to: - 14 units for a smaller meal - 16 units for a larger meal  Continue: - Metformin 1000 mg 2x a  day  Stop Invokana and switch to: -  Farxiga 10 mg daily in am.  Please stop at the lab.  Please return in 1.5 months with your sugar log.   - check sugars at different times of the day - check 2-3 times a day, rotating checks - advised for yearly eye exams >> she is UTD - will check BMP today as we started Invokana at last visit - check HbA1c today, also - Return to clinic in 1.5 mo with sugar log   2. Hypothyroidism - on LT4 100 mcg daily - advised her to Take the thyroid hormone every day, with water, >30 minutes before breakfast, separated by >4 hours from acid reflux medications, calcium, iron, multivitamins. She is now taking the LT4 correctly. - will recheck TFTs today  Component     Latest Ref Rng 12/14/2015  Sodium     135 - 145 mEq/L 137  Potassium     3.5 - 5.1 mEq/L 4.3  Chloride     96 - 112 mEq/L 102  CO2     19 - 32 mEq/L 27  Glucose     70 - 99 mg/dL 161 (H)  BUN     6 - 23 mg/dL 21  Creatinine     0.96 - 1.20 mg/dL 0.45  Calcium     8.4 - 10.5 mg/dL 9.5  GFR     >40.98 mL/min 60.16  Hemoglobin A1C     4.6 - 6.5 % 9.4 (H)  TSH     0.35 - 4.50 uIU/mL 5.39 (H)  Free T4     0.60 - 1.60 ng/dL 1.19   JYN8G slightly higher than before, as expected. TSH also higher, but I suspect relative noncompliance over the Holidays. I will recheck at next visit. The rest of the labs are good.

## 2015-12-19 ENCOUNTER — Other Ambulatory Visit: Payer: Self-pay | Admitting: Nurse Practitioner

## 2016-01-05 ENCOUNTER — Other Ambulatory Visit: Payer: Self-pay

## 2016-01-05 MED ORDER — TRAZODONE HCL 50 MG PO TABS: ORAL_TABLET | ORAL | Status: DC

## 2016-01-10 ENCOUNTER — Telehealth: Payer: Self-pay | Admitting: *Deleted

## 2016-01-10 NOTE — Telephone Encounter (Signed)
Patient has requested a letter head  of proof that she received  her in  flu shot in this office. Please call patient when statement is ready for pick up Contact [phone]

## 2016-01-10 NOTE — Telephone Encounter (Signed)
Attempted to call patient, left a VM that her letter was ready to be picked up.

## 2016-01-28 ENCOUNTER — Ambulatory Visit: Payer: Self-pay | Admitting: Internal Medicine

## 2016-01-31 ENCOUNTER — Other Ambulatory Visit: Payer: Self-pay | Admitting: Nurse Practitioner

## 2016-02-03 ENCOUNTER — Other Ambulatory Visit: Payer: Self-pay | Admitting: Nurse Practitioner

## 2016-02-24 ENCOUNTER — Other Ambulatory Visit: Payer: Self-pay | Admitting: Nurse Practitioner

## 2016-03-20 ENCOUNTER — Other Ambulatory Visit: Payer: Self-pay | Admitting: Internal Medicine

## 2016-04-13 ENCOUNTER — Other Ambulatory Visit: Payer: Self-pay

## 2016-04-13 MED ORDER — LISINOPRIL 10 MG PO TABS: ORAL_TABLET | ORAL | Status: DC

## 2016-04-25 ENCOUNTER — Other Ambulatory Visit: Payer: Self-pay | Admitting: Family Medicine

## 2016-04-25 MED ORDER — GABAPENTIN 400 MG PO CAPS: 400.0000 mg | ORAL_CAPSULE | Freq: Three times a day (TID) | ORAL | Status: DC

## 2016-04-25 NOTE — Telephone Encounter (Signed)
Refilled 02/10/2016. No follow up with another provider in our office.

## 2016-05-24 ENCOUNTER — Other Ambulatory Visit: Payer: Self-pay | Admitting: Nurse Practitioner

## 2016-05-24 ENCOUNTER — Other Ambulatory Visit: Payer: Self-pay | Admitting: Internal Medicine

## 2016-05-29 ENCOUNTER — Other Ambulatory Visit: Payer: Self-pay | Admitting: Internal Medicine

## 2016-06-06 ENCOUNTER — Other Ambulatory Visit: Payer: Self-pay | Admitting: Internal Medicine

## 2016-06-22 ENCOUNTER — Other Ambulatory Visit: Payer: Self-pay | Admitting: Internal Medicine

## 2016-07-02 ENCOUNTER — Other Ambulatory Visit: Payer: Self-pay | Admitting: Family Medicine

## 2016-07-03 NOTE — Telephone Encounter (Signed)
Medication last refilled 03/2016. Patient does not have a follow up with appointment. Recommendations for refill?

## 2016-07-11 ENCOUNTER — Other Ambulatory Visit: Payer: Self-pay | Admitting: Pediatrics

## 2016-07-11 DIAGNOSIS — Z1231 Encounter for screening mammogram for malignant neoplasm of breast: Secondary | ICD-10-CM

## 2016-07-24 ENCOUNTER — Other Ambulatory Visit: Payer: Self-pay | Admitting: Pediatrics

## 2016-07-24 ENCOUNTER — Ambulatory Visit
Admission: RE | Admit: 2016-07-24 | Discharge: 2016-07-24 | Disposition: A | Discharge status: Home / Self Care | Payer: Managed Care, Other (non HMO) | Source: Ambulatory Visit | Attending: Pediatrics | Admitting: Pediatrics

## 2016-07-24 DIAGNOSIS — Z1231 Encounter for screening mammogram for malignant neoplasm of breast: Secondary | ICD-10-CM | POA: Insufficient documentation

## 2016-07-27 ENCOUNTER — Other Ambulatory Visit: Payer: Self-pay

## 2016-07-27 MED ORDER — METFORMIN HCL 1000 MG PO TABS: ORAL_TABLET | ORAL | 0 refills | Status: AC

## 2016-08-14 ENCOUNTER — Other Ambulatory Visit: Payer: Self-pay | Admitting: Internal Medicine

## 2016-08-14 ENCOUNTER — Other Ambulatory Visit: Payer: Self-pay | Admitting: Nurse Practitioner

## 2016-08-14 NOTE — Telephone Encounter (Signed)
Gave patient 30 day supply of flex pen; as patient needs to make follow up appointment.

## 2016-08-14 NOTE — Telephone Encounter (Signed)
No longer seeing our providers

## 2016-08-25 ENCOUNTER — Other Ambulatory Visit: Payer: Self-pay | Admitting: Family Medicine

## 2016-09-02 ENCOUNTER — Other Ambulatory Visit: Payer: Self-pay | Admitting: Family Medicine

## 2016-09-20 ENCOUNTER — Other Ambulatory Visit: Payer: Self-pay | Admitting: Internal Medicine

## 2016-09-20 ENCOUNTER — Other Ambulatory Visit: Payer: Self-pay | Admitting: Nurse Practitioner

## 2016-10-01 ENCOUNTER — Other Ambulatory Visit: Payer: Self-pay | Admitting: Family Medicine

## 2016-10-12 ENCOUNTER — Other Ambulatory Visit: Payer: Self-pay | Admitting: Nurse Practitioner

## 2016-10-13 NOTE — Telephone Encounter (Signed)
Pt has not been seen since 08/27/15 by Naomie Deanarrie Doss. Has no appointment scheduled to establish care with another provider. Last A1c done 12/14/15 by Dr. Elvera LennoxGherghe.

## 2016-10-15 ENCOUNTER — Other Ambulatory Visit: Payer: Self-pay | Admitting: Family Medicine

## 2016-11-01 ENCOUNTER — Other Ambulatory Visit: Payer: Self-pay | Admitting: Nurse Practitioner

## 2016-11-19 ENCOUNTER — Other Ambulatory Visit: Payer: Self-pay | Admitting: Nurse Practitioner

## 2016-12-04 IMAGING — MG MM DIGITAL SCREENING BILAT W/ CAD
4 series · 4 of 4 positions shown · non-contrast
Comparison: Previous exam(s).

CLINICAL DATA: Screening.

EXAM:
DIGITAL SCREENING BILATERAL MAMMOGRAM WITH CAD

[L MLO]
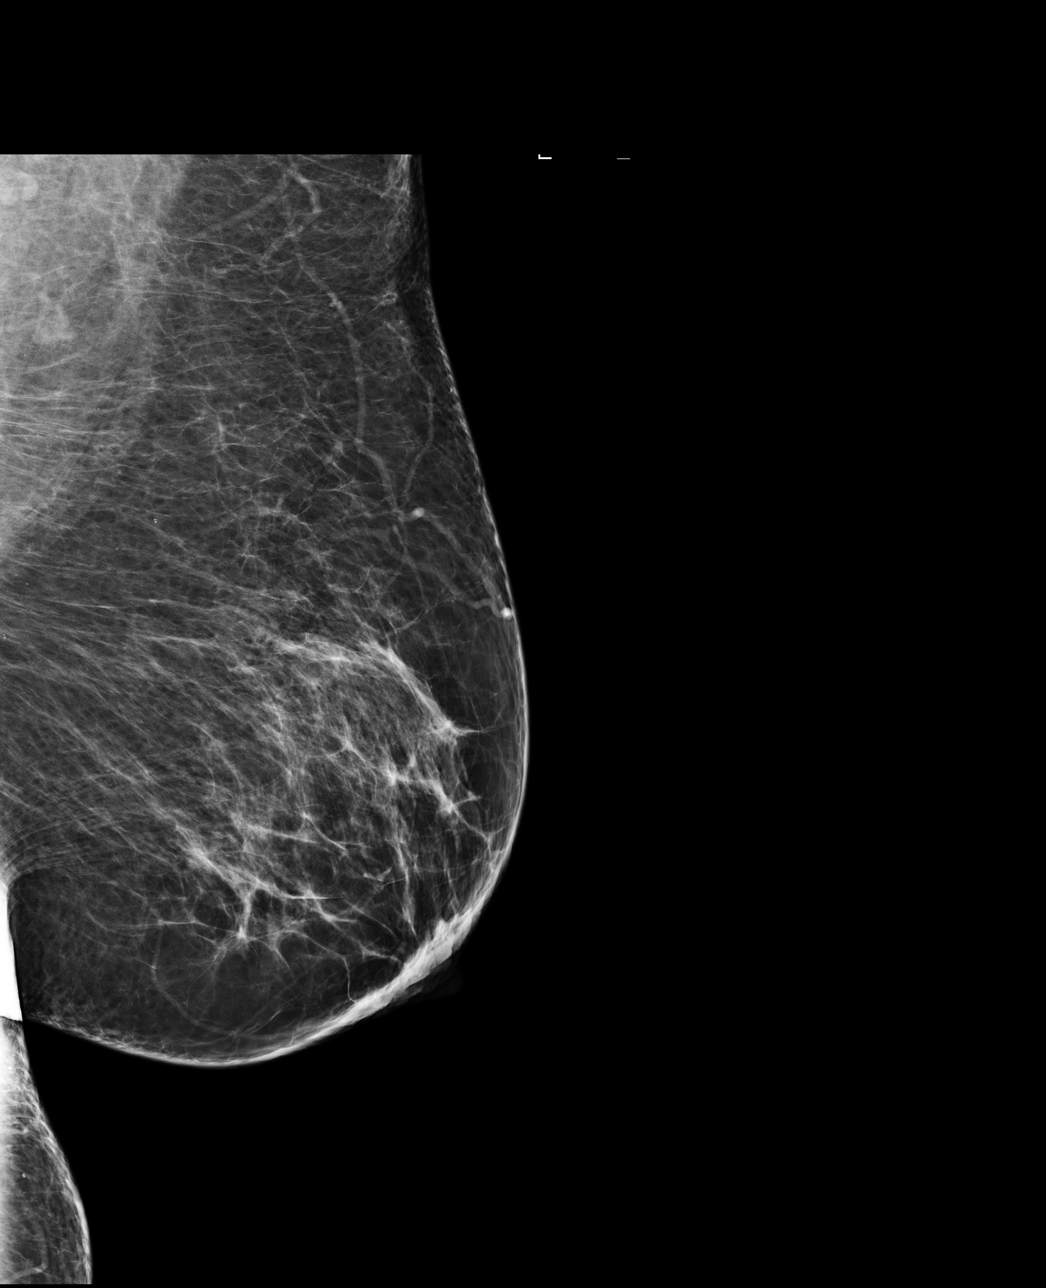

[R CC]
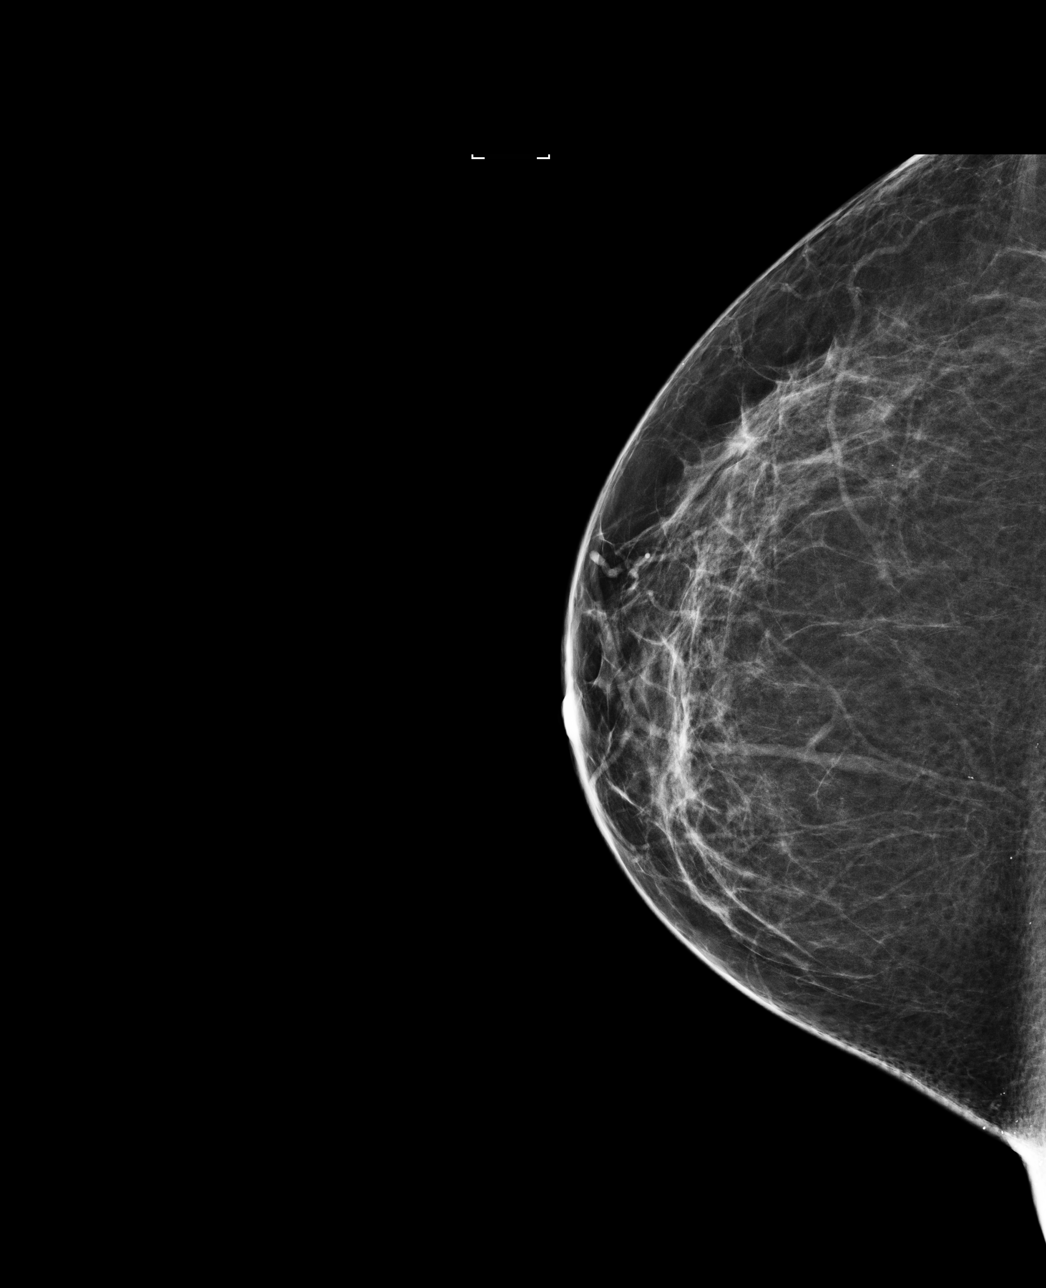

[L CC]
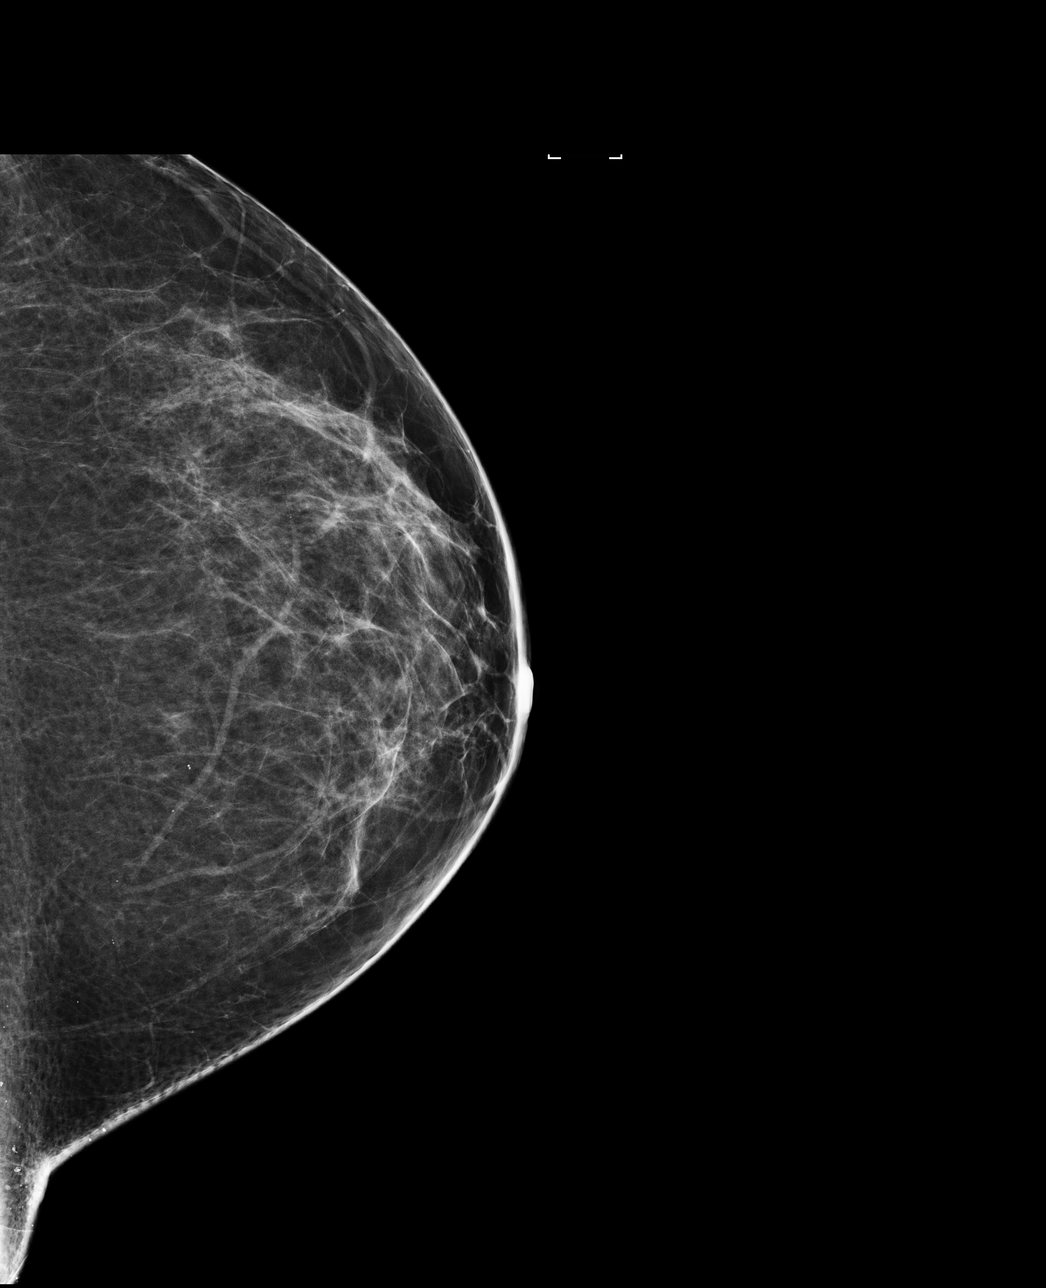

[R MLO]
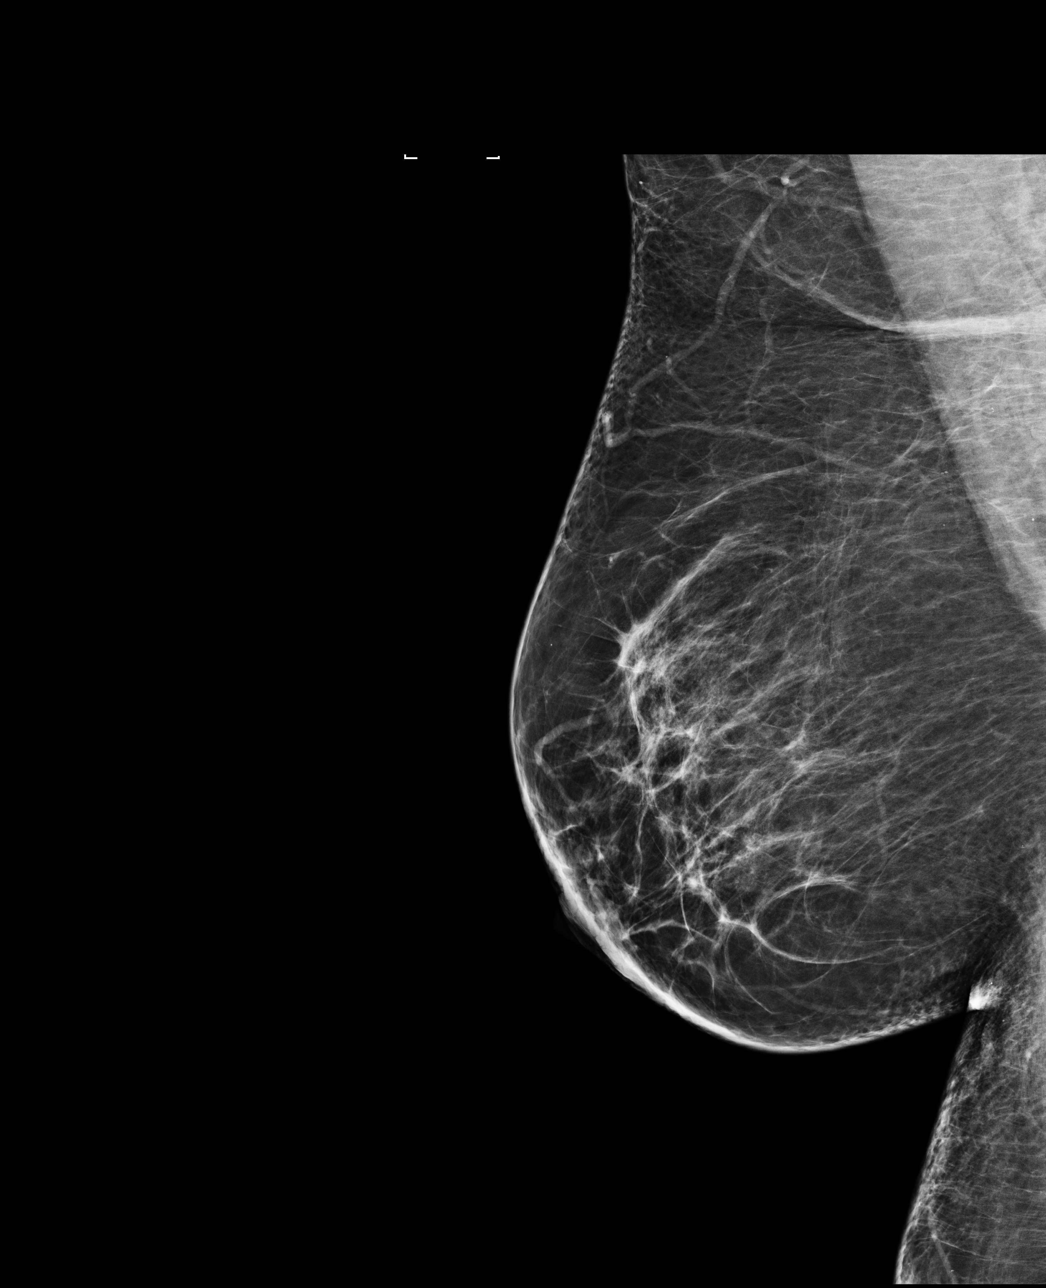

[4 of 4 positions shown; findings below may reference images not displayed]

ACR Breast Density Category b: There are scattered areas of
fibroglandular density.
FINDINGS: There are no findings suspicious for malignancy. Images were
processed with CAD.
IMPRESSION: No mammographic evidence of malignancy. A result letter of this
screening mammogram will be mailed directly to the patient.

RECOMMENDATION:
Screening mammogram in one year. (Code:AS-G-LCT)

BI-RADS CATEGORY  1: Negative.

## 2017-01-09 ENCOUNTER — Other Ambulatory Visit: Payer: Self-pay | Admitting: Nurse Practitioner

## 2017-08-01 ENCOUNTER — Other Ambulatory Visit: Payer: Self-pay | Admitting: Pediatrics

## 2017-08-01 DIAGNOSIS — Z1231 Encounter for screening mammogram for malignant neoplasm of breast: Secondary | ICD-10-CM

## 2017-08-23 ENCOUNTER — Ambulatory Visit
Admission: RE | Admit: 2017-08-23 | Discharge: 2017-08-23 | Disposition: A | Discharge status: Home / Self Care | Payer: Managed Care, Other (non HMO) | Source: Ambulatory Visit | Attending: Pediatrics | Admitting: Pediatrics

## 2017-08-23 DIAGNOSIS — Z1231 Encounter for screening mammogram for malignant neoplasm of breast: Secondary | ICD-10-CM | POA: Insufficient documentation

## 2018-07-17 ENCOUNTER — Other Ambulatory Visit: Payer: Self-pay | Admitting: Pediatrics

## 2018-07-17 DIAGNOSIS — Z1231 Encounter for screening mammogram for malignant neoplasm of breast: Secondary | ICD-10-CM

## 2018-08-28 ENCOUNTER — Ambulatory Visit
Admission: RE | Admit: 2018-08-28 | Discharge: 2018-08-28 | Disposition: A | Discharge status: Home / Self Care | Payer: Managed Care, Other (non HMO) | Source: Ambulatory Visit | Attending: Pediatrics | Admitting: Pediatrics

## 2018-08-28 DIAGNOSIS — Z1231 Encounter for screening mammogram for malignant neoplasm of breast: Secondary | ICD-10-CM | POA: Diagnosis not present

## 2019-08-12 ENCOUNTER — Other Ambulatory Visit: Payer: Self-pay | Admitting: Pediatrics

## 2019-08-12 DIAGNOSIS — Z1231 Encounter for screening mammogram for malignant neoplasm of breast: Secondary | ICD-10-CM

## 2019-09-17 ENCOUNTER — Ambulatory Visit
Admission: RE | Admit: 2019-09-17 | Discharge: 2019-09-17 | Disposition: A | Discharge status: Home / Self Care | Payer: BC Managed Care – PPO | Source: Ambulatory Visit | Attending: Pediatrics | Admitting: Pediatrics

## 2019-09-17 DIAGNOSIS — Z1231 Encounter for screening mammogram for malignant neoplasm of breast: Secondary | ICD-10-CM | POA: Diagnosis not present

## 2020-09-09 ENCOUNTER — Other Ambulatory Visit: Payer: Self-pay | Admitting: Pediatrics

## 2020-09-09 DIAGNOSIS — Z1231 Encounter for screening mammogram for malignant neoplasm of breast: Secondary | ICD-10-CM

## 2020-11-16 ENCOUNTER — Ambulatory Visit
Admission: RE | Admit: 2020-11-16 | Discharge: 2020-11-16 | Disposition: A | Discharge status: Home / Self Care | Payer: BC Managed Care – PPO | Source: Ambulatory Visit | Attending: Pediatrics | Admitting: Pediatrics

## 2020-11-16 ENCOUNTER — Other Ambulatory Visit: Payer: Self-pay

## 2020-11-16 DIAGNOSIS — Z1231 Encounter for screening mammogram for malignant neoplasm of breast: Secondary | ICD-10-CM | POA: Insufficient documentation

## 2021-10-31 ENCOUNTER — Emergency Department: Payer: PRIVATE HEALTH INSURANCE

## 2021-10-31 DIAGNOSIS — Z96652 Presence of left artificial knee joint: Secondary | ICD-10-CM | POA: Insufficient documentation

## 2021-10-31 DIAGNOSIS — M25562 Pain in left knee: Secondary | ICD-10-CM | POA: Diagnosis present

## 2021-10-31 DIAGNOSIS — Z5321 Procedure and treatment not carried out due to patient leaving prior to being seen by health care provider: Secondary | ICD-10-CM | POA: Diagnosis not present

## 2021-10-31 NOTE — ED Triage Notes (Signed)
Pt presents with a complaint of left knee pain - she states that this began last Wednesday and has progressively gotten worse. She states she was seen by outpatient ortho due to having a knee replacement 12 years ago and she was told that "part of the hardware is moving" causing her significant pain that radiates to her left hip. Pt is scheduled to have an appointment on Wednesday but is too uncomfortable to wait that long to be seen. She has OTC ibuprofen (800mg  TID), Tylenol (1000mg  BID), Gabapentin, and Lyrica without relief. Denies CP or SOB.

## 2021-11-01 ENCOUNTER — Emergency Department
Admission: EM | Admit: 2021-11-01 | Discharge: 2021-11-01 | Disposition: A | Discharge status: Home / Self Care | Payer: PRIVATE HEALTH INSURANCE | Attending: Emergency Medicine | Admitting: Emergency Medicine

## 2021-11-02 ENCOUNTER — Other Ambulatory Visit: Payer: Self-pay | Admitting: Orthopedic Surgery

## 2021-11-02 DIAGNOSIS — Z9660 Presence of unspecified orthopedic joint implant: Secondary | ICD-10-CM

## 2021-11-08 ENCOUNTER — Other Ambulatory Visit: Payer: Self-pay | Admitting: Pediatrics

## 2021-11-08 DIAGNOSIS — Z1231 Encounter for screening mammogram for malignant neoplasm of breast: Secondary | ICD-10-CM

## 2021-11-11 ENCOUNTER — Encounter
Admission: RE | Admit: 2021-11-11 | Discharge: 2021-11-11 | Disposition: A | Discharge status: Home / Self Care | Payer: PRIVATE HEALTH INSURANCE | Source: Ambulatory Visit | Attending: Orthopedic Surgery | Admitting: Orthopedic Surgery

## 2021-11-11 ENCOUNTER — Other Ambulatory Visit: Payer: Self-pay

## 2021-11-11 DIAGNOSIS — Z9660 Presence of unspecified orthopedic joint implant: Secondary | ICD-10-CM | POA: Insufficient documentation

## 2021-11-11 MED ORDER — TECHNETIUM TC 99M MEDRONATE IV KIT
20.0000 | PACK | Freq: Once | INTRAVENOUS | Status: AC | PRN
  Administered 2021-11-11: 22.29 via INTRAVENOUS

## 2021-11-17 ENCOUNTER — Ambulatory Visit
Admission: RE | Admit: 2021-11-17 | Discharge: 2021-11-17 | Disposition: A | Discharge status: Home / Self Care | Payer: PRIVATE HEALTH INSURANCE | Source: Ambulatory Visit | Attending: Pediatrics | Admitting: Pediatrics

## 2021-11-17 ENCOUNTER — Other Ambulatory Visit: Payer: Self-pay

## 2021-11-17 DIAGNOSIS — Z1231 Encounter for screening mammogram for malignant neoplasm of breast: Secondary | ICD-10-CM | POA: Insufficient documentation

## 2022-04-26 ENCOUNTER — Other Ambulatory Visit (HOSPITAL_COMMUNITY): Payer: No Typology Code available for payment source

## 2022-05-09 ENCOUNTER — Ambulatory Visit: Admit: 2022-05-09 | Payer: No Typology Code available for payment source | Admitting: Orthopedic Surgery

## 2022-05-09 DIAGNOSIS — T84093A Other mechanical complication of internal left knee prosthesis, initial encounter: Secondary | ICD-10-CM

## 2022-05-09 SURGERY — TOTAL KNEE REVISION
Anesthesia: Spinal | Site: Knee | Laterality: Left

## 2022-09-22 ENCOUNTER — Emergency Department: Payer: Managed Care, Other (non HMO)

## 2022-09-22 ENCOUNTER — Other Ambulatory Visit: Payer: Self-pay

## 2022-09-22 ENCOUNTER — Emergency Department
Admission: EM | Admit: 2022-09-22 | Discharge: 2022-09-22 | Disposition: A | Discharge status: Home / Self Care | Payer: Managed Care, Other (non HMO) | Attending: Student in an Organized Health Care Education/Training Program | Admitting: Student in an Organized Health Care Education/Training Program

## 2022-09-22 ENCOUNTER — Encounter: Payer: Self-pay | Admitting: Emergency Medicine

## 2022-09-22 DIAGNOSIS — R739 Hyperglycemia, unspecified: Secondary | ICD-10-CM | POA: Insufficient documentation

## 2022-09-22 DIAGNOSIS — R062 Wheezing: Secondary | ICD-10-CM | POA: Insufficient documentation

## 2022-09-22 DIAGNOSIS — R059 Cough, unspecified: Secondary | ICD-10-CM | POA: Insufficient documentation

## 2022-09-22 LAB — COMPREHENSIVE METABOLIC PANEL
ALT: 41 U/L (ref 0–44)
AST: 44 U/L — ABNORMAL HIGH (ref 15–41)
Albumin: 4.5 g/dL (ref 3.5–5.0)
Alkaline Phosphatase: 236 U/L — ABNORMAL HIGH (ref 38–126)
Anion gap: 10 (ref 5–15)
BUN: 18 mg/dL (ref 8–23)
CO2: 25 mmol/L (ref 22–32)
Calcium: 10.3 mg/dL (ref 8.9–10.3)
Chloride: 100 mmol/L (ref 98–111)
Creatinine, Ser: 0.79 mg/dL (ref 0.44–1.00)
GFR, Estimated: >60 mL/min (ref >60)
Glucose, Bld: 391 mg/dL — ABNORMAL HIGH (ref 70–99)
Potassium: 4 mmol/L (ref 3.5–5.1)
Sodium: 135 mmol/L (ref 135–145)
Total Bilirubin: 0.6 mg/dL (ref 0.3–1.2)
Total Protein: 8.1 g/dL (ref 6.5–8.1)

## 2022-09-22 LAB — LACTIC ACID, PLASMA
Lactic Acid, Venous: 2.3 mmol/L (ref 0.5–1.9)
Lactic Acid, Venous: 2.4 mmol/L (ref 0.5–1.9)

## 2022-09-22 LAB — BETA-HYDROXYBUTYRIC ACID: Beta-Hydroxybutyric Acid: 0.3 mmol/L — ABNORMAL HIGH (ref 0.05–0.27)

## 2022-09-22 LAB — CBC WITH DIFFERENTIAL/PLATELET
Abs Immature Granulocytes: 0.01 10*3/uL (ref 0.00–0.07)
Basophils Absolute: 0.1 10*3/uL (ref 0.0–0.1)
Basophils Relative: 2 %
Eosinophils Absolute: 0.5 10*3/uL (ref 0.0–0.5)
Eosinophils Relative: 10 %
HCT: 39 % (ref 36.0–46.0)
Hemoglobin: 12.8 g/dL (ref 12.0–15.0)
Immature Granulocytes: 0 %
Lymphocytes Relative: 15 %
Lymphs Abs: 0.8 10*3/uL (ref 0.7–4.0)
MCH: 28.9 pg (ref 26.0–34.0)
MCHC: 32.8 g/dL (ref 30.0–36.0)
MCV: 88 fL (ref 80.0–100.0)
Monocytes Absolute: 0.3 10*3/uL (ref 0.1–1.0)
Monocytes Relative: 6 %
Neutro Abs: 3.4 10*3/uL (ref 1.7–7.7)
Neutrophils Relative %: 67 %
Platelets: 69 10*3/uL — ABNORMAL LOW (ref 150–400)
RBC: 4.43 MIL/uL (ref 3.87–5.11)
RDW: 14 % (ref 11.5–15.5)
WBC: 5.1 10*3/uL (ref 4.0–10.5)
nRBC: 0 % (ref 0.0–0.2)

## 2022-09-22 LAB — URINALYSIS, ROUTINE W REFLEX MICROSCOPIC
Bacteria, UA: NONE SEEN
Bilirubin Urine: NEGATIVE
Glucose, UA: >=500 mg/dL — AB
Hgb urine dipstick: NEGATIVE
Ketones, ur: 5 mg/dL — AB
Nitrite: NEGATIVE
Protein, ur: NEGATIVE mg/dL
Specific Gravity, Urine: 1.023 (ref 1.005–1.030)
pH: 5 (ref 5.0–8.0)

## 2022-09-22 LAB — D-DIMER, QUANTITATIVE: D-Dimer, Quant: 0.47 ug/mL-FEU (ref 0.00–0.50)

## 2022-09-22 LAB — CBG MONITORING, ED
Glucose-Capillary: 375 mg/dL — ABNORMAL HIGH (ref 70–99)
Glucose-Capillary: 228 mg/dL — ABNORMAL HIGH (ref 70–99)

## 2022-09-22 MED ORDER — IPRATROPIUM-ALBUTEROL 0.5-2.5 (3) MG/3ML IN SOLN
3.0000 mL | Freq: Once | RESPIRATORY_TRACT | Status: AC
  Administered 2022-09-22: 3 mL via RESPIRATORY_TRACT
  Filled 2022-09-22: qty 3

## 2022-09-22 MED ORDER — SODIUM CHLORIDE 0.9 % IV BOLUS
1000.0000 mL | Freq: Once | INTRAVENOUS | Status: AC
  Administered 2022-09-22: 1000 mL via INTRAVENOUS

## 2022-09-22 MED ORDER — SODIUM CHLORIDE 0.9 % IV BOLUS
500.0000 mL | Freq: Once | INTRAVENOUS | Status: AC
  Administered 2022-09-22: 500 mL via INTRAVENOUS

## 2022-09-22 MED ORDER — INSULIN ASPART 100 UNIT/ML IJ SOLN
8.0000 [IU] | Freq: Once | INTRAMUSCULAR | Status: AC
  Administered 2022-09-22: 8 [IU] via INTRAVENOUS
  Filled 2022-09-22: qty 1

## 2022-09-22 MED ORDER — INSULIN ASPART 100 UNIT/ML IJ SOLN
10.0000 [IU] | Freq: Once | INTRAMUSCULAR | Status: AC
  Administered 2022-09-22: 10 [IU] via INTRAVENOUS
  Filled 2022-09-22: qty 1

## 2022-09-22 NOTE — ED Provider Notes (Signed)
[facility] Provider Note    Event Date/Time   First MD Initiated Contact with Patient 09/22/22 1250     (approximate)   History   Shortness of Breath and Hyperglycemia   HPI  Dawn Dean is a 61 y.o. female with recent diagnosis of COVID no smoking history presents to the ER for evaluation of elevated blood sugars as well as cough and wheezing.  Has not been on any steroids.  States her blood sugars have been running high.  No nausea or vomiting.  Has been taking albuterol inhaler with some improvement but using it quite frequently.  She denies any chest pain.  Denies any abdominal pain.  No leg swelling.     Physical Exam   Triage Vital Signs: ED Triage Vitals  Enc Vitals Group     BP 09/22/22 1159 138/72     Pulse Rate 09/22/22 1159 (!) 113     Resp 09/22/22 1159 18     Temp 09/22/22 1159 98.5 F (36.9 C)     Temp Source 09/22/22 1159 Oral     SpO2 09/22/22 1159 97 %     Weight 09/22/22 1159 212 lb (96.2 kg)     Height 09/22/22 1257 5\' 4"  (1.626 m)     Head Circumference --      Peak Flow --      Pain Score 09/22/22 1159 3     Pain Loc --      Pain Edu? --      Excl. in [place]? --     Most recent vital signs: Vitals:   09/22/22 1159 09/22/22 1512  BP: 138/72 118/62  Pulse: (!) 113 94  Resp: 18 17  Temp: 98.5 F (36.9 C) 97.8 F (36.6 C)  SpO2: 97% 96%     Constitutional: Alert  Eyes: Conjunctivae are normal.  Head: Atraumatic. Nose: No congestion/rhinnorhea. Mouth/Throat: Mucous membranes are moist.   Neck: Painless ROM.  Cardiovascular:   Good peripheral circulation. Respiratory: Normal respiratory effort.  Diffuse wheezing.  Speaking in complete sentences. Gastrointestinal: Soft and nontender.  Musculoskeletal:  no deformity Neurologic:  MAE spontaneously. No gross focal neurologic deficits are appreciated.  Skin:  Skin is warm, dry and intact. No rash noted. Psychiatric: Mood and affect are normal. Speech and  behavior are normal.    ED Results / Procedures / Treatments   Labs (all labs ordered are listed, but only abnormal results are displayed) Labs Reviewed  CBC WITH DIFFERENTIAL/PLATELET - Abnormal; Notable for the following components:      Result Value   Platelets 69 (*)    All other components within normal limits  COMPREHENSIVE METABOLIC PANEL - Abnormal; Notable for the following components:   Glucose, Bld 391 (*)    AST 44 (*)    Alkaline Phosphatase 236 (*)    All other components within normal limits  LACTIC ACID, PLASMA - Abnormal; Notable for the following components:   Lactic Acid, Venous 2.3 (*)    All other components within normal limits  LACTIC ACID, PLASMA - Abnormal; Notable for the following components:   Lactic Acid, Venous 2.4 (*)    All other components within normal limits  BETA-HYDROXYBUTYRIC ACID - Abnormal; Notable for the following components:   Beta-Hydroxybutyric Acid 0.30 (*)    All other components within normal limits  URINALYSIS, ROUTINE W REFLEX MICROSCOPIC - Abnormal; Notable for the following components:   Color, Urine YELLOW (*)    APPearance HAZY (*)  Glucose, UA >=500 (*)    Ketones, ur 5 (*)    Leukocytes,Ua TRACE (*)    All other components within normal limits  CBG MONITORING, ED - Abnormal; Notable for the following components:   Glucose-Capillary 375 (*)    All other components within normal limits  CBG MONITORING, ED - Abnormal; Notable for the following components:   Glucose-Capillary 228 (*)    All other components within normal limits  D-DIMER, QUANTITATIVE     EKG  ED ECG REPORT I, Willy Eddy, the attending physician, personally viewed and interpreted this ECG.   Date: 09/22/2022  EKG Time: 12:03  Rate: 100  Rhythm: sinus  Axis: normal  Intervals: normal  ST&T Change: no stemi, no depressions    RADIOLOGY Please see ED Course for my review and interpretation.  I personally reviewed all radiographic  images ordered to evaluate for the above acute complaints and reviewed radiology reports and findings.  These findings were personally discussed with the patient.  Please see medical record for radiology report.    PROCEDURES:  Critical Care performed: No  Procedures   MEDICATIONS ORDERED IN ED: Medications  sodium chloride 0.9 % bolus 1,000 mL (1,000 mLs Intravenous New Bag/Given 09/22/22 1327)  insulin aspart (novoLOG) injection 10 Units (10 Units Intravenous Given 09/22/22 1327)  ipratropium-albuterol (DUONEB) 0.5-2.5 (3) MG/3ML nebulizer solution 3 mL (3 mLs Nebulization Given 09/22/22 1351)  sodium chloride 0.9 % bolus 500 mL (500 mLs Intravenous New Bag/Given 09/22/22 1551)  insulin aspart (novoLOG) injection 8 Units (8 Units Intravenous Given 09/22/22 1551)  ipratropium-albuterol (DUONEB) 0.5-2.5 (3) MG/3ML nebulizer solution 3 mL (3 mLs Nebulization Given 09/22/22 1553)     IMPRESSION / MDM / ASSESSMENT AND PLAN / ED COURSE  I reviewed the triage vital signs and the nursing notes.                              Differential diagnosis includes, but is not limited to, COVID-19, pneumonia, COPD, CHF, PE, dehydration, DKA, HHS, hyperglycemia  Patient presenting to the ER for evaluation of symptoms as described above.  Based on symptoms, risk factors and considered above differential, this presenting complaint could reflect a potentially life-threatening illness therefore the patient will be placed on continuous pulse oximetry and telemetry for monitoring.  Laboratory evaluation will be sent to evaluate for the above complaints.      Clinical Course as of 09/22/22 1635  Fri Sep 22, 2022  1343 Chest x-ray on my review and interpretation does not show any evidence of consolidation. [PR]  1343 Blood work does show borderline elevated lactate.  D-dimer is negative.  She is hyperglycemic with borderline elevated beta hydroxybutyric acid but her anion gap is normal.  We will give IV  fluids as well as IV insulin reassess. [PR]  1514 Blood sugar is improving now down to 220.  Awaiting repeat lactate. [PR]  1631 Patient reassessed.  Feels significantly improved asking about going home.  We discussed the findings in her work-up thus far.  Her wheezing did significantly improve after nebulizer treatment.  She already has prescription for prednisone as well as spacer chamber sent to her pharmacy by her PCP.  I discussed concern regarding her elevated blood sugars.  She does have glucose monitoring strips.  We discussed her lactate being mildly elevated from 2.3-2.4.  She appears well perfused.  She is on metformin and has received nebulizer treatments which may be contributing  to this.  Discussed option for additional fluids and recheck but patient feels well at this point and I think would be appropriate for trial of outpatient management which she agrees to.  Discussed strict return precautions patient agrees to return if she starts feeling worse at any point. [PR]    Clinical Course User Index [PR] Willy Eddy, MD    FINAL CLINICAL IMPRESSION(S) / ED DIAGNOSES   Final diagnoses:  Hyperglycemia  Wheezing     Rx / DC Orders   ED Discharge Orders     None        Note:  This document was prepared using Dragon voice recognition software and may include unintentional dictation errors.    Willy Eddy, MD 09/22/22 [phone]

## 2022-09-22 NOTE — ED Triage Notes (Signed)
Pt arrives with c/o hyperglycemia and SOB. Pt had covid about 4 weeks ago. Per pt, she was sent by PCP for evaluation. Pt endorses wheezing.

## 2022-09-22 NOTE — ED Notes (Signed)
See triage note  Presents from PCP with a 4 week hx of congestion,wheezing   States she had positive COVID test at that time  Also has been on inhalers,and antibiotics for possible bronchitis  But cont's to have same sx's  Afebrile on arrival

## 2022-09-29 ENCOUNTER — Encounter (HOSPITAL_COMMUNITY): Admission: RE | Admit: 2022-09-29 | Payer: No Typology Code available for payment source | Source: Ambulatory Visit

## 2022-10-12 ENCOUNTER — Encounter: Admission: RE | Payer: Self-pay | Source: Home / Self Care

## 2022-10-12 ENCOUNTER — Ambulatory Visit
Admission: RE | Admit: 2022-10-12 | Payer: No Typology Code available for payment source | Source: Home / Self Care | Admitting: Orthopedic Surgery

## 2022-10-12 SURGERY — TOTAL KNEE REVISION
Anesthesia: Spinal | Site: Knee | Laterality: Left

## 2023-01-03 ENCOUNTER — Other Ambulatory Visit: Payer: Self-pay | Admitting: Pediatrics

## 2023-01-03 DIAGNOSIS — Z1231 Encounter for screening mammogram for malignant neoplasm of breast: Secondary | ICD-10-CM

## 2023-01-24 ENCOUNTER — Ambulatory Visit
Admission: RE | Admit: 2023-01-24 | Discharge: 2023-01-24 | Disposition: A | Discharge status: Home / Self Care | Payer: Managed Care, Other (non HMO) | Source: Ambulatory Visit | Attending: Pediatrics | Admitting: Pediatrics

## 2023-01-24 DIAGNOSIS — Z1231 Encounter for screening mammogram for malignant neoplasm of breast: Secondary | ICD-10-CM | POA: Insufficient documentation

## 2024-02-11 ENCOUNTER — Other Ambulatory Visit: Payer: Self-pay | Admitting: Pediatrics

## 2024-02-11 DIAGNOSIS — Z1231 Encounter for screening mammogram for malignant neoplasm of breast: Secondary | ICD-10-CM

## 2024-02-20 ENCOUNTER — Ambulatory Visit
Admission: RE | Admit: 2024-02-20 | Discharge: 2024-02-20 | Disposition: A | Discharge status: Home / Self Care | Source: Ambulatory Visit | Attending: Pediatrics | Admitting: Pediatrics

## 2024-02-20 DIAGNOSIS — Z1231 Encounter for screening mammogram for malignant neoplasm of breast: Secondary | ICD-10-CM | POA: Diagnosis present

## 2024-08-18 NOTE — Progress Notes (Signed)
 Anesthesia Review:  PCP: [facility]- LOV 07/25/24  Cardiologist :  PPM/ ICD: Device Orders: Rep Notified:  Chest x-ray : EKG : Echo : Stress test: Cardiac Cath :   Activity level:  Sleep Study/ CPAP : Fasting Blood Sugar :      / Checks Blood Sugar -- times a day:     Dm- type  Hgba1c- 07/25/24-8.4   Blood Thinner/ Instructions /Last Dose: ASA / Instructions/ Last Dose :

## 2024-08-18 NOTE — Patient Instructions (Addendum)
 SURGICAL WAITING ROOM VISITATION  Patients having surgery or a procedure may have no more than 2 support people in the waiting area - these visitors may rotate.    Children under the age of 43 must have an adult with them who is not the patient.  Visitors with respiratory illnesses are discouraged from visiting and should remain at home.  If the patient needs to stay at the hospital during part of their recovery, the visitor guidelines for inpatient rooms apply. Pre-op nurse will coordinate an appropriate time for 1 support person to accompany patient in pre-op.  This support person may not rotate.    Please refer to the [facility] website for the visitor guidelines for Inpatients (after your surgery is over and you are in a regular room).       Your procedure is scheduled on:  08/26/2024    Report to [facility] Main Entrance    Report to admitting at  1000 AM   Call this number if you have problems the morning of surgery [phone]   Do not eat food :After Midnight.   After Midnight you may have the following liquids until _ 0930_____ AM  DAY OF SURGERY  Water  Non-Citrus Juices (without pulp, NO RED-Apple, White grape, White cranberry) Black Coffee (NO MILK/CREAM OR CREAMERS, sugar ok)  Clear Tea (NO MILK/CREAM OR CREAMERS, sugar ok) regular and decaf                             Plain Jell-O (NO RED)                                           Fruit ices (not with fruit pulp, NO RED)                                     Popsicles (NO RED)                                                               Sports drinks like Gatorade (NO RED)                   The day of surgery:  Drink ONE (1) Pre-Surgery Clear Ensure or G2 at   0930 am the morning of surgery  This drink was given to you during your hospital  pre-op appointment visit. Nothing else to drink after completing the  Pre-Surgery Clear Ensure or G2.          If you have questions, please contact your  surgeon's office.      Oral Hygiene is also important to reduce your risk of infection.                                    Remember - BRUSH YOUR TEETH THE MORNING OF SURGERY WITH YOUR REGULAR TOOTHPASTE  DENTURES WILL BE REMOVED PRIOR TO SURGERY PLEASE DO NOT APPLY Poly grip OR ADHESIVES!!!   Do NOT smoke after  Midnight   Stop all vitamins and herbal supplements 7 days before surgery.   Take these medicines the morning of surgery with A SIP OF WATER :    lyrica, synthroid , omeprazole                Jardiance - hold for 72 hours prior to procedure.  Last dose on 08/22/2024              Metformin - none am of surgery              Mounjaro- last dose on   DO NOT TAKE ANY ORAL DIABETIC MEDICATIONS DAY OF YOUR SURGERY  Bring CPAP mask and tubing day of surgery.                              You may not have any metal on your body including hair pins, jewelry, and body piercing             Do not wear make-up, lotions, powders, perfumes/cologne, or deodorant  Do not wear nail polish including gel and S&S, artificial/acrylic nails, or any other type of covering on natural nails including finger and toenails. If you have artificial nails, gel coating, etc. that needs to be removed by a nail salon please have this removed prior to surgery or surgery may need to be canceled/ delayed if the surgeon/ anesthesia feels like they are unable to be safely monitored.   Do not shave  48 hours prior to surgery.               Men may shave face and neck.   Do not bring valuables to the hospital. [facility] IS NOT             RESPONSIBLE   FOR VALUABLES.   Contacts, glasses, dentures or bridgework may not be worn into surgery.   Bring small overnight bag day of surgery.   DO NOT BRING YOUR HOME MEDICATIONS TO THE HOSPITAL. PHARMACY WILL DISPENSE MEDICATIONS LISTED ON YOUR MEDICATION LIST TO YOU DURING YOUR ADMISSION IN THE HOSPITAL!    Patients discharged on the day of surgery will not be allowed  to drive home.  Someone NEEDS to stay with you for the first 24 hours after anesthesia.   Special Instructions: Bring a copy of your healthcare power of attorney and living will documents the day of surgery if you haven't scanned them before.              Please read over the following fact sheets you were given: IF YOU HAVE QUESTIONS ABOUT YOUR PRE-OP INSTRUCTIONS PLEASE CALL [phone].   If you received a COVID test during your pre-op visit  it is requested that you wear a mask when out in public, stay away from anyone that may not be feeling well and notify your surgeon if you develop symptoms. If you test positive for Covid or have been in contact with anyone that has tested positive in the last 10 days please notify you surgeon.      Pre-operative 5 CHG Bath Instructions   You can play a key role in reducing the risk of infection after surgery. Your skin needs to be as free of germs as possible. You can reduce the number of germs on your skin by washing with CHG (chlorhexidine gluconate) soap before surgery. CHG is an antiseptic soap that kills germs and continues to kill germs even after washing.  DO NOT use if you have an allergy to chlorhexidine/CHG or antibacterial soaps. If your skin becomes reddened or irritated, stop using the CHG and notify one of our RNs at [phone].   Please shower with the CHG soap starting 4 days before surgery using the following schedule:     Please keep in mind the following:  DO NOT shave, including legs and underarms, starting the day of your first shower.   You may shave your face at any point before/day of surgery.  Place clean sheets on your bed the day you start using CHG soap. Use a clean washcloth (not used since being washed) for each shower. DO NOT sleep with pets once you start using the CHG.   CHG Shower Instructions:  If you choose to wash your hair and private area, wash first with your normal shampoo/soap.  After you use  shampoo/soap, rinse your hair and body thoroughly to remove shampoo/soap residue.  Turn the water  OFF and apply about 3 tablespoons (45 ml) of CHG soap to a CLEAN washcloth.  Apply CHG soap ONLY FROM YOUR NECK DOWN TO YOUR TOES (washing for 3-5 minutes)  DO NOT use CHG soap on face, private areas, open wounds, or sores.  Pay special attention to the area where your surgery is being performed.  If you are having back surgery, having someone wash your back for you may be helpful. Wait 2 minutes after CHG soap is applied, then you may rinse off the CHG soap.  Pat dry with a clean towel  Put on clean clothes/pajamas   If you choose to wear lotion, please use ONLY the CHG-compatible lotions on the back of this paper.     Additional instructions for the day of surgery: DO NOT APPLY any lotions, deodorants, cologne, or perfumes.   Put on clean/comfortable clothes.  Brush your teeth.  Ask your nurse before applying any prescription medications to the skin.      CHG Compatible Lotions   Aveeno Moisturizing lotion  Cetaphil Moisturizing Cream  Cetaphil Moisturizing Lotion  Clairol Herbal Essence Moisturizing Lotion, Dry Skin  Clairol Herbal Essence Moisturizing Lotion, Extra Dry Skin  Clairol Herbal Essence Moisturizing Lotion, Normal Skin  Curel Age Defying Therapeutic Moisturizing Lotion with Alpha Hydroxy  Curel Extreme Care Body Lotion  Curel Soothing Hands Moisturizing Hand Lotion  Curel Therapeutic Moisturizing Cream, Fragrance-Free  Curel Therapeutic Moisturizing Lotion, Fragrance-Free  Curel Therapeutic Moisturizing Lotion, Original Formula  Eucerin Daily Replenishing Lotion  Eucerin Dry Skin Therapy Plus Alpha Hydroxy Crme  Eucerin Dry Skin Therapy Plus Alpha Hydroxy Lotion  Eucerin Original Crme  Eucerin Original Lotion  Eucerin Plus Crme Eucerin Plus Lotion  Eucerin TriLipid Replenishing Lotion  Keri Anti-Bacterial Hand Lotion  Keri Deep Conditioning Original Lotion Dry  Skin Formula Softly Scented  Keri Deep Conditioning Original Lotion, Fragrance Free Sensitive Skin Formula  Keri Lotion Fast Absorbing Fragrance Free Sensitive Skin Formula  Keri Lotion Fast Absorbing Softly Scented Dry Skin Formula  Keri Original Lotion  Keri Skin Renewal Lotion Keri Silky Smooth Lotion  Keri Silky Smooth Sensitive Skin Lotion  Nivea Body Creamy Conditioning Oil  Nivea Body Extra Enriched Teacher, adult education Moisturizing Lotion Nivea Crme  Nivea Skin Firming Lotion  NutraDerm 30 Skin Lotion  NutraDerm Skin Lotion  NutraDerm Therapeutic Skin Cream  NutraDerm Therapeutic Skin Lotion  ProShield Protective Hand Cream  Provon moisturizing lotion

## 2024-08-19 ENCOUNTER — Encounter (HOSPITAL_COMMUNITY)
Admission: RE | Admit: 2024-08-19 | Discharge: 2024-08-19 | Disposition: A | Discharge status: Home / Self Care | Source: Ambulatory Visit | Attending: Orthopedic Surgery | Admitting: Orthopedic Surgery

## 2024-08-19 ENCOUNTER — Encounter (HOSPITAL_COMMUNITY): Payer: Self-pay

## 2024-08-19 ENCOUNTER — Other Ambulatory Visit: Payer: Self-pay

## 2024-08-19 VITALS — BP 106/69 | HR 75 | Temp 98.4°F | Resp 16 | Ht 66.0 in | Wt 172.0 lb

## 2024-08-19 DIAGNOSIS — Z87891 Personal history of nicotine dependence: Secondary | ICD-10-CM | POA: Insufficient documentation

## 2024-08-19 DIAGNOSIS — E039 Hypothyroidism, unspecified: Secondary | ICD-10-CM | POA: Diagnosis not present

## 2024-08-19 DIAGNOSIS — D731 Hypersplenism: Secondary | ICD-10-CM | POA: Insufficient documentation

## 2024-08-19 DIAGNOSIS — E119 Type 2 diabetes mellitus without complications: Secondary | ICD-10-CM | POA: Insufficient documentation

## 2024-08-19 DIAGNOSIS — D6959 Other secondary thrombocytopenia: Secondary | ICD-10-CM | POA: Diagnosis not present

## 2024-08-19 DIAGNOSIS — Z01818 Encounter for other preprocedural examination: Secondary | ICD-10-CM | POA: Diagnosis present

## 2024-08-19 DIAGNOSIS — T84033A Mechanical loosening of internal left knee prosthetic joint, initial encounter: Secondary | ICD-10-CM | POA: Diagnosis not present

## 2024-08-19 DIAGNOSIS — Y792 Prosthetic and other implants, materials and accessory orthopedic devices associated with adverse incidents: Secondary | ICD-10-CM | POA: Insufficient documentation

## 2024-08-19 LAB — BASIC METABOLIC PANEL WITH GFR
Anion gap: 15 (ref 5–15)
BUN: 26 mg/dL — ABNORMAL HIGH (ref 8–23)
CO2: 24 mmol/L (ref 22–32)
Calcium: 9.5 mg/dL (ref 8.9–10.3)
Chloride: 99 mmol/L (ref 98–111)
Creatinine, Ser: 0.92 mg/dL (ref 0.44–1.00)
GFR, Estimated: >60 mL/min (ref >60)
Glucose, Bld: 132 mg/dL — ABNORMAL HIGH (ref 70–99)
Potassium: 4.2 mmol/L (ref 3.5–5.1)
Sodium: 138 mmol/L (ref 135–145)

## 2024-08-19 LAB — SURGICAL PCR SCREEN
MRSA, PCR: NEGATIVE
Staphylococcus aureus: NEGATIVE

## 2024-08-19 LAB — CBC
HCT: 41.2 % (ref 36.0–46.0)
Hemoglobin: 12.7 g/dL (ref 12.0–15.0)
MCH: 28.5 pg (ref 26.0–34.0)
MCHC: 30.8 g/dL (ref 30.0–36.0)
MCV: 92.6 fL (ref 80.0–100.0)
Platelets: 81 K/uL — ABNORMAL LOW (ref 150–400)
RBC: 4.45 MIL/uL (ref 3.87–5.11)
RDW: 13.3 % (ref 11.5–15.5)
WBC: 3.8 K/uL — ABNORMAL LOW (ref 4.0–10.5)
nRBC: 0 % (ref 0.0–0.2)

## 2024-08-19 LAB — HEMOGLOBIN A1C
Hgb A1c MFr Bld: 7 % — ABNORMAL HIGH (ref 4.8–5.6)
Mean Plasma Glucose: 154.2 mg/dL

## 2024-08-19 LAB — GLUCOSE, CAPILLARY: Glucose-Capillary: 122 mg/dL — ABNORMAL HIGH (ref 70–99)

## 2024-08-20 NOTE — Progress Notes (Signed)
 FYI for pre-op:    Patient with findings of WBC of 3.8 and plt of 81 I called patient - she has documented history of leukopenia and thrombocytopenia and has been seen by Heme/onc at [facility] Last visit in April 2025 - available in EPIC    Thrombocytopenia due to hypersplenism   Metjian, Charletta Lenis, MD  [address]  [place], [place] [address]  Phone: tel:[phone]  [phone]         Rosina Calin, PA-C Orthopedic Surgery [facility] [place] [phone]

## 2024-08-20 NOTE — Progress Notes (Signed)
 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 former smoker with h/o thrombocytopenia due to hypersplenism (Platelets 81, stable), hypothyroidism, DM II, failed left total knee scheduled for above procedure 08/26/24 with Dr. Cough Ernie.   S/p ACDF C5-C6.   Pt follows with hematology for thrombocytopenia.  Pt last seen 03/12/24. Per notes denies any hemorrhagic symptoms, such as epistaxis, gum bleeding, hemoptysis, hematemesis, coffee-ground emesis, bright red blood per rectum, melenic stools, hematuria, easy or spontaneous bruising, or the appearance of petechia or purpura. There have been no symptoms consistent with venous thrombosis, such as limb asymmetry, pleuritic chest pain, shortness of breath, headaches, or abdominal pain. Platelet count stable.   Pt reports she can climb a flight of stairs without chest pain or shortness of breath. EKG with no acute findings.  VS: BP 106/69   Pulse 75   Temp 36.9 C (Oral)   Resp 16   Ht 5' 6 (1.676 m)   Wt 78 kg   LMP 10/25/2012   SpO2 98%   BMI 27.76 kg/m   PROVIDERS: Delfina Pao, MD is PCP    LABS: Labs reviewed: Acceptable for surgery. (all labs ordered are listed, but only abnormal results are displayed)  Labs Reviewed  HEMOGLOBIN A1C - Abnormal; Notable for the following components:      Result Value   Hgb A1c MFr Bld 7.0 (*)    All other components within normal limits  BASIC METABOLIC PANEL WITH GFR - Abnormal; Notable for the following components:   Glucose, Bld 132 (*)    BUN 26 (*)    All other components within normal limits  CBC - Abnormal; Notable for the following components:    WBC 3.8 (*)    Platelets 81 (*)    All other components within normal limits  GLUCOSE, CAPILLARY - Abnormal; Notable for the following components:   Glucose-Capillary 122 (*)    All other components within normal limits  SURGICAL PCR SCREEN  TYPE AND SCREEN     IMAGES:   EKG:   CV:  Past Medical History:  Diagnosis Date   Arthritis    NECK AND KNEE   Diabetes mellitus without complication (HCC)    TYPE II /diagnosed -20 yrs. ago    Hypothyroidism    Mental disorder     Past Surgical History:  Procedure Laterality Date   ANTERIOR CERVICAL DECOMP/DISCECTOMY FUSION  12/05/2012   Procedure: ANTERIOR CERVICAL DECOMPRESSION/DISCECTOMY FUSION 1 LEVEL;  Surgeon: Donaciano Sprang, MD;  Location: [facility] OR;  Service: Orthopedics;  Laterality: [name];  ACDF C5-6   CERVICAL DISCECTOMY  12/05/2012   CESAREAN SECTION     1985   COLONOSCOPY WITH PROPOFOL  [name] 06/03/2015   Procedure: COLONOSCOPY WITH PROPOFOL ;  Surgeon: Rogelia Copping, MD;  Location: [facility];  Service: Endoscopy;  Laterality: [name];   DILATION AND CURETTAGE OF UTERUS     JOINT REPLACEMENT Left    APPROX 7 OR 8 YRS AGO   KNEE ARTHROSCOPY Left    right rotator cuff surgery      sinus surgery     for deviated septum    MEDICATIONS:  aspirin EC 81  MG tablet   atorvastatin  (LIPITOR) 20 MG tablet   citalopram  (CELEXA ) 20 MG tablet   empagliflozin  (JARDIANCE ) 10 MG TABS tablet   estradiol (ESTRACE) 0.1 MG/GM vaginal cream   levothyroxine  (SYNTHROID , LEVOTHROID) 100 MCG tablet   metFORMIN  (GLUCOPHAGE ) 1000 MG tablet   Multiple Vitamin (MULTIVITAMIN WITH MINERALS) TABS tablet   omeprazole (PRILOSEC OTC) 20 MG tablet   ondansetron  (ZOFRAN ) 4 MG tablet   pregabalin (LYRICA) 150 MG capsule   QUEtiapine  (SEROQUEL ) 100 MG tablet   tirzepatide (MOUNJARO) 15 MG/0.5ML Pen   No current facility-administered medications for this encounter.      Harlene Hoots Ward, PA-C [facility] Pre-Surgical Testing [phone]

## 2024-08-20 NOTE — Anesthesia Preprocedure Evaluation (Addendum)
 Anesthesia Evaluation  Patient identified by MRN, date of birth, ID band Patient awake    Reviewed: Allergy & Precautions, H&P , NPO status , Patient's Chart, lab work & pertinent test results  Airway Mallampati: II   Neck ROM: full    Dental   Pulmonary former smoker   breath sounds clear to auscultation       Cardiovascular negative cardio ROS  Rhythm:regular Rate:Normal     Neuro/Psych  Neuromuscular disease    GI/Hepatic   Endo/Other  diabetes, Type 2Hypothyroidism    Renal/GU      Musculoskeletal  (+) Arthritis ,    Abdominal   Peds  Hematology   Anesthesia Other Findings   Reproductive/Obstetrics                              Anesthesia Physical Anesthesia Plan  ASA: 2  Anesthesia Plan: General   Post-op Pain Management: Regional block*   Induction: Intravenous  PONV Risk Score and Plan: 3 and Ondansetron , Dexamethasone , Midazolam  and Treatment may vary due to age or medical condition  Airway Management Planned: Oral ETT  Additional Equipment:   Intra-op Plan:   Post-operative Plan: Extubation in OR  Informed Consent: I have reviewed the patients History and Physical, chart, labs and discussed the procedure including the risks, benefits and alternatives for the proposed anesthesia with the patient or authorized representative who has indicated his/her understanding and acceptance.     Dental advisory given  Plan Discussed with: CRNA, Anesthesiologist and Surgeon  Anesthesia Plan Comments: (See PAT note 08/19/24)         Anesthesia Quick Evaluation

## 2024-08-26 ENCOUNTER — Encounter (HOSPITAL_COMMUNITY): Payer: Self-pay | Admitting: Orthopedic Surgery

## 2024-08-26 ENCOUNTER — Other Ambulatory Visit: Payer: Self-pay

## 2024-08-26 ENCOUNTER — Ambulatory Visit (HOSPITAL_COMMUNITY): Admitting: Anesthesiology

## 2024-08-26 ENCOUNTER — Ambulatory Visit (HOSPITAL_COMMUNITY): Payer: Self-pay | Admitting: Medical

## 2024-08-26 ENCOUNTER — Observation Stay (HOSPITAL_COMMUNITY)
Admission: RE | Admit: 2024-08-26 | Discharge: 2024-08-27 | Disposition: A | Discharge status: Home / Self Care | Attending: Orthopedic Surgery | Admitting: Orthopedic Surgery

## 2024-08-26 ENCOUNTER — Encounter (HOSPITAL_COMMUNITY): Admission: RE | Disposition: A | Payer: Self-pay | Source: Home / Self Care | Attending: Orthopedic Surgery

## 2024-08-26 DIAGNOSIS — Z7984 Long term (current) use of oral hypoglycemic drugs: Secondary | ICD-10-CM | POA: Insufficient documentation

## 2024-08-26 DIAGNOSIS — T84033D Mechanical loosening of internal left knee prosthetic joint, subsequent encounter: Secondary | ICD-10-CM | POA: Diagnosis not present

## 2024-08-26 DIAGNOSIS — Z87891 Personal history of nicotine dependence: Secondary | ICD-10-CM | POA: Insufficient documentation

## 2024-08-26 DIAGNOSIS — Y792 Prosthetic and other implants, materials and accessory orthopedic devices associated with adverse incidents: Secondary | ICD-10-CM | POA: Diagnosis not present

## 2024-08-26 DIAGNOSIS — T84093A Other mechanical complication of internal left knee prosthesis, initial encounter: Secondary | ICD-10-CM

## 2024-08-26 DIAGNOSIS — Z01818 Encounter for other preprocedural examination: Secondary | ICD-10-CM

## 2024-08-26 DIAGNOSIS — M25562 Pain in left knee: Secondary | ICD-10-CM | POA: Diagnosis present

## 2024-08-26 DIAGNOSIS — Z23 Encounter for immunization: Secondary | ICD-10-CM | POA: Diagnosis not present

## 2024-08-26 DIAGNOSIS — Z96652 Presence of left artificial knee joint: Principal | ICD-10-CM

## 2024-08-26 DIAGNOSIS — E119 Type 2 diabetes mellitus without complications: Secondary | ICD-10-CM | POA: Insufficient documentation

## 2024-08-26 DIAGNOSIS — E039 Hypothyroidism, unspecified: Secondary | ICD-10-CM | POA: Diagnosis not present

## 2024-08-26 HISTORY — PX: TOTAL KNEE REVISION: SHX996

## 2024-08-26 LAB — GLUCOSE, CAPILLARY
Glucose-Capillary: 218 mg/dL — ABNORMAL HIGH (ref 70–99)
Glucose-Capillary: 179 mg/dL — ABNORMAL HIGH (ref 70–99)
Glucose-Capillary: 294 mg/dL — ABNORMAL HIGH (ref 70–99)

## 2024-08-26 LAB — ABO/RH: ABO/RH(D): O POS

## 2024-08-26 LAB — TYPE AND SCREEN
ABO/RH(D): O POS
Antibody Screen: NEGATIVE

## 2024-08-26 SURGERY — TOTAL KNEE REVISION
Anesthesia: General | Site: Knee | Laterality: Left

## 2024-08-26 MED ORDER — CHLORHEXIDINE GLUCONATE 0.12 % MT SOLN
15.0000 mL | Freq: Once | OROMUCOSAL | Status: AC
  Administered 2024-08-26: 15 mL via OROMUCOSAL

## 2024-08-26 MED ORDER — CEFAZOLIN SODIUM-DEXTROSE 2-4 GM/100ML-% IV SOLN
2.0000 g | INTRAVENOUS | Status: AC
  Administered 2024-08-26: 2 g via INTRAVENOUS
  Filled 2024-08-26: qty 100

## 2024-08-26 MED ORDER — TRANEXAMIC ACID-NACL 1000-0.7 MG/100ML-% IV SOLN
1000.0000 mg | Freq: Once | INTRAVENOUS | Status: AC
  Administered 2024-08-26: 1000 mg via INTRAVENOUS
  Filled 2024-08-26: qty 100

## 2024-08-26 MED ORDER — METOCLOPRAMIDE HCL 5 MG PO TABS: 5.0000 mg | ORAL_TABLET | Freq: Three times a day (TID) | ORAL | Status: DC | PRN

## 2024-08-26 MED ORDER — ONDANSETRON HCL 4 MG/2ML IJ SOLN: 4.0000 mg | Freq: Four times a day (QID) | INTRAMUSCULAR | Status: DC | PRN

## 2024-08-26 MED ORDER — ASPIRIN 81 MG PO CHEW
81.0000 mg | CHEWABLE_TABLET | Freq: Two times a day (BID) | ORAL | Status: DC
  Administered 2024-08-26 – 2024-08-27 (×2): 81 mg via ORAL
  Filled 2024-08-26 (×2): qty 1

## 2024-08-26 MED ORDER — SENNA 8.6 MG PO TABS
2.0000 | ORAL_TABLET | Freq: Every day | ORAL | Status: DC
  Administered 2024-08-26: 17.2 mg via ORAL
  Filled 2024-08-26: qty 2

## 2024-08-26 MED ORDER — SODIUM CHLORIDE (PF) 0.9 % IJ SOLN
INTRAMUSCULAR | Status: AC
  Filled 2024-08-26: qty 30

## 2024-08-26 MED ORDER — KETOROLAC TROMETHAMINE 15 MG/ML IJ SOLN
7.5000 mg | Freq: Four times a day (QID) | INTRAMUSCULAR | Status: AC
  Administered 2024-08-26 – 2024-08-27 (×2): 7.5 mg via INTRAVENOUS
  Filled 2024-08-26 (×2): qty 1

## 2024-08-26 MED ORDER — PHENYLEPHRINE 80 MCG/ML (10ML) SYRINGE FOR IV PUSH (FOR BLOOD PRESSURE SUPPORT)
PREFILLED_SYRINGE | INTRAVENOUS | Status: AC
  Filled 2024-08-26: qty 10

## 2024-08-26 MED ORDER — ROCURONIUM BROMIDE 100 MG/10ML IV SOLN
INTRAVENOUS | Status: DC | PRN
  Administered 2024-08-26: 50 mg via INTRAVENOUS

## 2024-08-26 MED ORDER — OXYCODONE HCL 5 MG PO TABS
10.0000 mg | ORAL_TABLET | ORAL | Status: DC | PRN
  Administered 2024-08-26: 10 mg via ORAL
  Filled 2024-08-26 (×3): qty 2

## 2024-08-26 MED ORDER — PROPOFOL 10 MG/ML IV BOLUS
INTRAVENOUS | Status: AC
  Filled 2024-08-26: qty 20

## 2024-08-26 MED ORDER — LEVOTHYROXINE SODIUM 100 MCG PO TABS
100.0000 ug | ORAL_TABLET | Freq: Every day | ORAL | Status: DC
  Administered 2024-08-27: 100 ug via ORAL
  Filled 2024-08-26: qty 1

## 2024-08-26 MED ORDER — METHOCARBAMOL 500 MG PO TABS
500.0000 mg | ORAL_TABLET | Freq: Four times a day (QID) | ORAL | Status: DC | PRN
  Administered 2024-08-26: 500 mg via ORAL
  Filled 2024-08-26: qty 1

## 2024-08-26 MED ORDER — SODIUM CHLORIDE 0.9 % IV SOLN
INTRAVENOUS | Status: DC
Start: 2024-08-26 — End: 2024-08-27

## 2024-08-26 MED ORDER — PHENOL 1.4 % MT LIQD: 1.0000 | OROMUCOSAL | Status: DC | PRN

## 2024-08-26 MED ORDER — QUETIAPINE FUMARATE 50 MG PO TABS
100.0000 mg | ORAL_TABLET | Freq: Every day | ORAL | Status: DC
  Administered 2024-08-26: 100 mg via ORAL
  Filled 2024-08-26: qty 2

## 2024-08-26 MED ORDER — ROPIVACAINE HCL 5 MG/ML IJ SOLN
INTRAMUSCULAR | Status: DC | PRN
  Administered 2024-08-26: 25 mL via PERINEURAL

## 2024-08-26 MED ORDER — MENTHOL 3 MG MT LOZG: 1.0000 | LOZENGE | OROMUCOSAL | Status: DC | PRN

## 2024-08-26 MED ORDER — BUPIVACAINE-EPINEPHRINE (PF) 0.25% -1:200000 IJ SOLN
INTRAMUSCULAR | Status: AC
  Filled 2024-08-26: qty 30

## 2024-08-26 MED ORDER — DEXAMETHASONE SODIUM PHOSPHATE 10 MG/ML IJ SOLN
8.0000 mg | Freq: Once | INTRAMUSCULAR | Status: AC
  Administered 2024-08-26: 8 mg via INTRAVENOUS

## 2024-08-26 MED ORDER — METHOCARBAMOL 1000 MG/10ML IJ SOLN: 500.0000 mg | Freq: Four times a day (QID) | INTRAMUSCULAR | Status: DC | PRN

## 2024-08-26 MED ORDER — FENTANYL CITRATE PF 50 MCG/ML IJ SOSY
PREFILLED_SYRINGE | INTRAMUSCULAR | Status: AC
  Filled 2024-08-26: qty 3

## 2024-08-26 MED ORDER — TOBRAMYCIN SULFATE 1.2 G IJ SOLR
INTRAMUSCULAR | Status: AC
  Filled 2024-08-26: qty 2.4

## 2024-08-26 MED ORDER — METOCLOPRAMIDE HCL 5 MG/ML IJ SOLN: 5.0000 mg | Freq: Three times a day (TID) | INTRAMUSCULAR | Status: DC | PRN

## 2024-08-26 MED ORDER — HYDROMORPHONE HCL 1 MG/ML IJ SOLN: 0.5000 mg | INTRAMUSCULAR | Status: DC | PRN

## 2024-08-26 MED ORDER — OXYCODONE HCL 5 MG/5ML PO SOLN: 5.0000 mg | Freq: Once | ORAL | Status: DC | PRN

## 2024-08-26 MED ORDER — INSULIN ASPART 100 UNIT/ML IJ SOLN
0.0000 [IU] | INTRAMUSCULAR | Status: DC | PRN
  Administered 2024-08-26: 4 [IU] via SUBCUTANEOUS
  Filled 2024-08-26: qty 1

## 2024-08-26 MED ORDER — ONDANSETRON HCL 4 MG PO TABS: 4.0000 mg | ORAL_TABLET | Freq: Four times a day (QID) | ORAL | Status: DC | PRN

## 2024-08-26 MED ORDER — PREGABALIN 75 MG PO CAPS
150.0000 mg | ORAL_CAPSULE | Freq: Two times a day (BID) | ORAL | Status: DC
  Administered 2024-08-26 – 2024-08-27 (×2): 150 mg via ORAL
  Filled 2024-08-26 (×2): qty 2

## 2024-08-26 MED ORDER — LABETALOL HCL 5 MG/ML IV SOLN
INTRAVENOUS | Status: DC | PRN
Start: 2024-08-26 — End: 2024-08-26
  Administered 2024-08-26: 5 mg via INTRAVENOUS

## 2024-08-26 MED ORDER — POVIDONE-IODINE 10 % EX SWAB: 2.0000 | Freq: Once | CUTANEOUS | Status: DC

## 2024-08-26 MED ORDER — KETOROLAC TROMETHAMINE 30 MG/ML IJ SOLN
INTRAMUSCULAR | Status: DC | PRN
  Administered 2024-08-26: 30 mg via INTRAMUSCULAR

## 2024-08-26 MED ORDER — ATORVASTATIN CALCIUM 20 MG PO TABS
20.0000 mg | ORAL_TABLET | Freq: Every day | ORAL | Status: DC
  Administered 2024-08-26: 20 mg via ORAL
  Filled 2024-08-26: qty 1

## 2024-08-26 MED ORDER — EMPAGLIFLOZIN 10 MG PO TABS
10.0000 mg | ORAL_TABLET | Freq: Every day | ORAL | Status: DC
  Administered 2024-08-27: 10 mg via ORAL
  Filled 2024-08-26: qty 1

## 2024-08-26 MED ORDER — TRANEXAMIC ACID-NACL 1000-0.7 MG/100ML-% IV SOLN
1000.0000 mg | INTRAVENOUS | Status: AC
  Administered 2024-08-26: 1000 mg via INTRAVENOUS
  Filled 2024-08-26: qty 100

## 2024-08-26 MED ORDER — ORAL CARE MOUTH RINSE: 15.0000 mL | Freq: Once | OROMUCOSAL | Status: AC

## 2024-08-26 MED ORDER — INFLUENZA VAC SPLIT HIGH-DOSE 0.5 ML IM SUSY
0.5000 mL | PREFILLED_SYRINGE | INTRAMUSCULAR | Status: AC
  Administered 2024-08-27: 0.5 mL via INTRAMUSCULAR
  Filled 2024-08-26: qty 0.5

## 2024-08-26 MED ORDER — FENTANYL CITRATE PF 50 MCG/ML IJ SOSY
50.0000 ug | PREFILLED_SYRINGE | Freq: Once | INTRAMUSCULAR | Status: AC
  Administered 2024-08-26: 50 ug via INTRAVENOUS
  Filled 2024-08-26: qty 2

## 2024-08-26 MED ORDER — EPHEDRINE 5 MG/ML INJ
INTRAVENOUS | Status: AC
  Filled 2024-08-26: qty 5

## 2024-08-26 MED ORDER — SODIUM CHLORIDE (PF) 0.9 % IJ SOLN
INTRAMUSCULAR | Status: DC | PRN
  Administered 2024-08-26: 30 mL

## 2024-08-26 MED ORDER — CITALOPRAM HYDROBROMIDE 20 MG PO TABS
20.0000 mg | ORAL_TABLET | Freq: Every day | ORAL | Status: DC
  Administered 2024-08-26: 20 mg via ORAL
  Filled 2024-08-26: qty 1

## 2024-08-26 MED ORDER — CEFAZOLIN SODIUM-DEXTROSE 2-4 GM/100ML-% IV SOLN
2.0000 g | Freq: Four times a day (QID) | INTRAVENOUS | Status: AC
  Administered 2024-08-26 – 2024-08-27 (×2): 2 g via INTRAVENOUS
  Filled 2024-08-26 (×2): qty 100

## 2024-08-26 MED ORDER — VANCOMYCIN HCL 1000 MG IV SOLR
INTRAVENOUS | Status: AC
  Filled 2024-08-26: qty 40

## 2024-08-26 MED ORDER — BISACODYL 10 MG RE SUPP: 10.0000 mg | Freq: Every day | RECTAL | Status: DC | PRN

## 2024-08-26 MED ORDER — ONDANSETRON HCL 4 MG/2ML IJ SOLN
INTRAMUSCULAR | Status: AC
Start: 2024-08-26 — End: 2024-08-26
  Filled 2024-08-26: qty 2

## 2024-08-26 MED ORDER — ONDANSETRON HCL 4 MG/2ML IJ SOLN
INTRAMUSCULAR | Status: DC | PRN
  Administered 2024-08-26: 4 mg via INTRAVENOUS

## 2024-08-26 MED ORDER — ALUM & MAG HYDROXIDE-SIMETH 200-200-20 MG/5ML PO SUSP: 30.0000 mL | ORAL | Status: DC | PRN

## 2024-08-26 MED ORDER — POLYETHYLENE GLYCOL 3350 17 G PO PACK
17.0000 g | PACK | Freq: Two times a day (BID) | ORAL | Status: DC
  Administered 2024-08-26: 17 g via ORAL
  Filled 2024-08-26 (×2): qty 1

## 2024-08-26 MED ORDER — MIDAZOLAM HCL 2 MG/2ML IJ SOLN
1.0000 mg | Freq: Once | INTRAMUSCULAR | Status: AC
  Administered 2024-08-26: 1 mg via INTRAVENOUS
  Filled 2024-08-26: qty 2

## 2024-08-26 MED ORDER — HYDROMORPHONE HCL 1 MG/ML IJ SOLN
INTRAMUSCULAR | Status: DC | PRN
  Administered 2024-08-26: 1 mg via INTRAVENOUS

## 2024-08-26 MED ORDER — BUPIVACAINE-EPINEPHRINE 0.25% -1:200000 IJ SOLN
INTRAMUSCULAR | Status: DC | PRN
  Administered 2024-08-26: 30 mL

## 2024-08-26 MED ORDER — METFORMIN HCL 500 MG PO TABS
1000.0000 mg | ORAL_TABLET | Freq: Two times a day (BID) | ORAL | Status: DC
Start: 2024-08-27 — End: 2024-08-27
  Administered 2024-08-27: 1000 mg via ORAL
  Filled 2024-08-26: qty 2

## 2024-08-26 MED ORDER — FENTANYL CITRATE (PF) 100 MCG/2ML IJ SOLN
INTRAMUSCULAR | Status: AC
  Filled 2024-08-26: qty 2

## 2024-08-26 MED ORDER — STERILE WATER FOR IRRIGATION IR SOLN
Status: DC | PRN
  Administered 2024-08-26: 2000 mL

## 2024-08-26 MED ORDER — HYDROMORPHONE HCL 2 MG/ML IJ SOLN
INTRAMUSCULAR | Status: AC
  Filled 2024-08-26: qty 1

## 2024-08-26 MED ORDER — FENTANYL CITRATE PF 50 MCG/ML IJ SOSY
25.0000 ug | PREFILLED_SYRINGE | INTRAMUSCULAR | Status: DC | PRN
  Administered 2024-08-26 (×2): 50 ug via INTRAVENOUS

## 2024-08-26 MED ORDER — DEXAMETHASONE SODIUM PHOSPHATE 10 MG/ML IJ SOLN
INTRAMUSCULAR | Status: AC
  Filled 2024-08-26: qty 1

## 2024-08-26 MED ORDER — OXYCODONE HCL 5 MG PO TABS: 5.0000 mg | ORAL_TABLET | Freq: Once | ORAL | Status: DC | PRN

## 2024-08-26 MED ORDER — INSULIN ASPART 100 UNIT/ML IJ SOLN
0.0000 [IU] | Freq: Three times a day (TID) | INTRAMUSCULAR | Status: DC
  Administered 2024-08-27: 5 [IU] via SUBCUTANEOUS

## 2024-08-26 MED ORDER — ACETAMINOPHEN 500 MG PO TABS
1000.0000 mg | ORAL_TABLET | Freq: Four times a day (QID) | ORAL | Status: DC
Start: 2024-08-26 — End: 2024-08-30
  Administered 2024-08-26 – 2024-08-27 (×3): 1000 mg via ORAL
  Filled 2024-08-26 (×3): qty 2

## 2024-08-26 MED ORDER — DEXAMETHASONE SODIUM PHOSPHATE 10 MG/ML IJ SOLN
10.0000 mg | Freq: Once | INTRAMUSCULAR | Status: AC
  Administered 2024-08-27: 10 mg via INTRAVENOUS
  Filled 2024-08-26: qty 1

## 2024-08-26 MED ORDER — OMEPRAZOLE 20 MG PO CPDR
20.0000 mg | DELAYED_RELEASE_CAPSULE | Freq: Two times a day (BID) | ORAL | Status: DC
Start: 2024-08-27 — End: 2024-08-27
  Administered 2024-08-27: 20 mg via ORAL
  Filled 2024-08-26: qty 1

## 2024-08-26 MED ORDER — MIDAZOLAM HCL 2 MG/2ML IJ SOLN
INTRAMUSCULAR | Status: AC
  Filled 2024-08-26: qty 2

## 2024-08-26 MED ORDER — LACTATED RINGERS IV SOLN: INTRAVENOUS | Status: DC

## 2024-08-26 MED ORDER — OXYCODONE HCL 5 MG PO TABS
5.0000 mg | ORAL_TABLET | ORAL | Status: DC | PRN
  Administered 2024-08-26 – 2024-08-27 (×4): 10 mg via ORAL
  Filled 2024-08-26 (×2): qty 2

## 2024-08-26 MED ORDER — DIPHENHYDRAMINE HCL 12.5 MG/5ML PO ELIX: 12.5000 mg | ORAL_SOLUTION | ORAL | Status: DC | PRN

## 2024-08-26 MED ORDER — STERILE WATER FOR INJECTION IJ SOLN: INTRAMUSCULAR | Status: DC | PRN

## 2024-08-26 MED ORDER — PHENYLEPHRINE 80 MCG/ML (10ML) SYRINGE FOR IV PUSH (FOR BLOOD PRESSURE SUPPORT)
PREFILLED_SYRINGE | INTRAVENOUS | Status: DC | PRN
  Administered 2024-08-26: 80 ug via INTRAVENOUS

## 2024-08-26 MED ORDER — EPHEDRINE SULFATE-NACL 50-0.9 MG/10ML-% IV SOSY
PREFILLED_SYRINGE | INTRAVENOUS | Status: DC | PRN
  Administered 2024-08-26: 5 mg via INTRAVENOUS

## 2024-08-26 MED ORDER — SUGAMMADEX SODIUM 200 MG/2ML IV SOLN
INTRAVENOUS | Status: DC | PRN
  Administered 2024-08-26: 200 mg via INTRAVENOUS

## 2024-08-26 MED ORDER — PROPOFOL 10 MG/ML IV BOLUS
INTRAVENOUS | Status: DC | PRN
  Administered 2024-08-26: 150 mg via INTRAVENOUS

## 2024-08-26 MED ORDER — FENTANYL CITRATE (PF) 100 MCG/2ML IJ SOLN
INTRAMUSCULAR | Status: DC | PRN
  Administered 2024-08-26: 100 ug via INTRAVENOUS

## 2024-08-26 MED ORDER — KETOROLAC TROMETHAMINE 30 MG/ML IJ SOLN
INTRAMUSCULAR | Status: AC
  Filled 2024-08-26: qty 1

## 2024-08-26 MED ORDER — 0.9 % SODIUM CHLORIDE (POUR BTL) OPTIME
TOPICAL | Status: DC | PRN
  Administered 2024-08-26: 1000 mL

## 2024-08-26 MED ORDER — ROCURONIUM BROMIDE 10 MG/ML (PF) SYRINGE
PREFILLED_SYRINGE | INTRAVENOUS | Status: AC
  Filled 2024-08-26: qty 10

## 2024-08-26 SURGICAL SUPPLY — 59 items
AUGMENT DISTAL FEM SZ4 4 KNEE (Miscellaneous) IMPLANT
AUGMENT POST FEM SZ4 4 (Miscellaneous) IMPLANT
BAG COUNTER SPONGE SURGICOUNT (BAG) IMPLANT
BAG DECANTER FOR FLEXI CONT (MISCELLANEOUS) IMPLANT
BAG ZIPLOCK 12X15 (MISCELLANEOUS) IMPLANT
BLADE SAW SGTL 11.0X1.19X90.0M (BLADE) IMPLANT
BLADE SAW SGTL 13.0X1.19X90.0M (BLADE) ×1 IMPLANT
BLADE SAW SGTL 81X20 HD (BLADE) ×1 IMPLANT
BNDG ELASTIC 6INX 5YD STR LF (GAUZE/BANDAGES/DRESSINGS) ×1 IMPLANT
BOWL SMART MIX CTS (DISPOSABLE) IMPLANT
BRUSH FEMORAL CANAL (MISCELLANEOUS) IMPLANT
CEMENT HV SMART SET (Cement) IMPLANT
CEMENT RESTRICTOR DEPUY SZ 4 (Cement) IMPLANT
COMPONENT FEM ATN CR SZ4 LT (Femur) IMPLANT
CONE SLEEVE ATTUNE KNEE SM (Sleeve) IMPLANT
COVER SURGICAL LIGHT HANDLE (MISCELLANEOUS) ×1 IMPLANT
CUFF TRNQT CYL 34X4.125X (TOURNIQUET CUFF) ×1 IMPLANT
DERMABOND ADVANCED .7 DNX12 (GAUZE/BANDAGES/DRESSINGS) ×1 IMPLANT
DRAPE U-SHAPE 47X51 STRL (DRAPES) ×1 IMPLANT
DRSG AQUACEL AG ADV 3.5X10 (GAUZE/BANDAGES/DRESSINGS) ×1 IMPLANT
DRSG AQUACEL AG ADV 3.5X14 (GAUZE/BANDAGES/DRESSINGS) IMPLANT
DURAPREP 26ML APPLICATOR (WOUND CARE) ×2 IMPLANT
ELECT REM PT RETURN 15FT ADLT (MISCELLANEOUS) ×1 IMPLANT
GAUZE SPONGE 2X2 8PLY STRL LF (GAUZE/BANDAGES/DRESSINGS) IMPLANT
GLOVE BIO SURGEON STRL SZ 6 (GLOVE) ×1 IMPLANT
GLOVE BIOGEL PI IND STRL 6.5 (GLOVE) ×1 IMPLANT
GLOVE BIOGEL PI IND STRL 7.5 (GLOVE) ×1 IMPLANT
GLOVE ORTHO TXT STRL SZ7.5 (GLOVE) ×2 IMPLANT
GOWN STRL REUS W/ TWL LRG LVL3 (GOWN DISPOSABLE) ×2 IMPLANT
HOLDER FOLEY CATH W/STRAP (MISCELLANEOUS) IMPLANT
INSERT TIB CMT ATTUNE RP SZ3 (Knees) IMPLANT
INSERT TIB CRS ATTUNE SZ4 8 (Insert) IMPLANT
KIT TURNOVER KIT A (KITS) ×1 IMPLANT
MANIFOLD NEPTUNE II (INSTRUMENTS) ×1 IMPLANT
NDL SAFETY ECLIPSE 18X1.5 (NEEDLE) ×1 IMPLANT
NS IRRIG 1000ML POUR BTL (IV SOLUTION) ×1 IMPLANT
PACK TOTAL KNEE CUSTOM (KITS) ×1 IMPLANT
PENCIL SMOKE EVACUATOR (MISCELLANEOUS) ×1 IMPLANT
PIN FIX SIGMA LCS THRD HI (PIN) IMPLANT
PROTECTOR NERVE ULNAR (MISCELLANEOUS) ×1 IMPLANT
RESTRICTOR CEMENT SZ 5 C-STEM (Cement) IMPLANT
SET HNDPC FAN SPRY TIP SCT (DISPOSABLE) ×1 IMPLANT
SET PAD KNEE POSITIONER (MISCELLANEOUS) ×1 IMPLANT
SLEEVE KNEE ATTUNE 29MM (Knees) IMPLANT
SOLUTION PRONTOSAN WOUND 350ML (IRRIGATION / IRRIGATOR) IMPLANT
SPIKE FLUID TRANSFER (MISCELLANEOUS) IMPLANT
STAPLER SKIN PROX 35W (STAPLE) IMPLANT
STEM CEMT ATTUNE 14X80 (Knees) IMPLANT
STEM REV CEMENTED 14X50MM (Stem) IMPLANT
SUT MNCRL AB 3-0 PS2 18 (SUTURE) ×1 IMPLANT
SUT STRATAFIX PDS+ 0 24IN (SUTURE) ×1 IMPLANT
SUT VIC AB 1 CT1 36 (SUTURE) ×1 IMPLANT
SUT VIC AB 2-0 CT1 TAPERPNT 27 (SUTURE) ×3 IMPLANT
SYR 3ML LL SCALE MARK (SYRINGE) ×1 IMPLANT
TOWER CARTRIDGE SMART MIX (DISPOSABLE) IMPLANT
TRAY FOLEY MTR SLVR 16FR STAT (SET/KITS/TRAYS/PACK) ×1 IMPLANT
TUBE SUCTION HIGH CAP CLEAR NV (SUCTIONS) ×1 IMPLANT
WATER STERILE IRR 1000ML POUR (IV SOLUTION) ×1 IMPLANT
WRAP KNEE MAXI GEL POST OP (GAUZE/BANDAGES/DRESSINGS) ×1 IMPLANT

## 2024-08-26 NOTE — Transfer of Care (Signed)
 Immediate Anesthesia Transfer of Care Note  Patient: Dawn Dean  Procedure(s) Performed: TOTAL KNEE REVISION (Left: Knee)  Patient Location: PACU  Anesthesia Type:General and Regional  Level of Consciousness: awake, alert , oriented, and patient cooperative  Airway & Oxygen Therapy: Patient Spontanous Breathing and Patient connected to face mask oxygen  Post-op Assessment: Report given to RN and Post -op Vital signs reviewed and stable  Post vital signs: Reviewed and stable  Last Vitals:  Vitals Value Taken Time  BP 118/102 08/26/24 15:26  Temp    Pulse 92 08/26/24 15:28  Resp 14 08/26/24 15:28  SpO2 99 % 08/26/24 15:28  Vitals shown include unfiled device data.  Last Pain:  Vitals:   08/26/24 1226  TempSrc:   PainSc: 0-No pain         Complications: No notable events documented.

## 2024-08-26 NOTE — Brief Op Note (Signed)
 08/26/2024  3:05 PM  PATIENT:  Dawn Dean  63 y.o. 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  PROCEDURE:  Procedure(s): TOTAL KNEE REVISION (Left)  SURGEON:  Surgeons and Role:    DEWAINE Ernie Cough, MD - Primary  PHYSICIAN ASSISTANT: Rosina Calin, PA-C  ANESTHESIA:   general  EBL:  300 mL   BLOOD ADMINISTERED:none  DRAINS: none   LOCAL MEDICATIONS USED:  MARCAINE      SPECIMEN:  No Specimen  DISPOSITION OF SPECIMEN:  [name]  COUNTS:  YES  TOURNIQUET:   Total Tourniquet Time Documented: Thigh (Left) - 57 minutes Total: Thigh (Left) - 57 minutes   DICTATION: .Other Dictation: Dictation Number [id number]  PLAN OF CARE: Admit to inpatient   PATIENT DISPOSITION:  PACU - hemodynamically stable.   Delay start of Pharmacological VTE agent (>24hrs) due to surgical blood loss or risk of bleeding: no

## 2024-08-26 NOTE — Anesthesia Procedure Notes (Signed)
 Procedure Name: Intubation Date/Time: 08/26/2024 1:16 PM  Performed by: Nada Corean CROME, CRNAPre-anesthesia Checklist: Suction available, Emergency Drugs available, Patient identified, Patient being monitored and Timeout performed Patient Re-evaluated:Patient Re-evaluated prior to induction Oxygen Delivery Method: Circle system utilized Preoxygenation: Pre-oxygenation with 100% oxygen Induction Type: IV induction Ventilation: Mask ventilation without difficulty Laryngoscope Size: Mac and 3 Grade View: Grade I Tube type: Oral Tube size: 7.0 mm Number of attempts: 1 Airway Equipment and Method: Stylet Placement Confirmation: ETT inserted through vocal cords under direct vision, positive ETCO2 and breath sounds checked- equal and bilateral Secured at: 22 cm Tube secured with: Tape Dental Injury: Teeth and Oropharynx as per pre-operative assessment

## 2024-08-26 NOTE — H&P (Signed)
 TOTAL KNEE REVISION ADMISSION H&P  Patient is being admitted for left revision total knee arthroplasty.  Therapy Plans: outpatient therapy [facility] [place] Disposition: Home with husband Planned DVT Prophylaxis: aspirin 81mg  BID DME needed: walker PCP: Darice Cooper - clearance received TXA: IV Allergies: duloxetine Anesthesia Concerns: none BMI: 30 Last HgbA1c: 7.4%   Other: - recent right rotator cuff repair in January - [facility] HOSP[facility] - oxycodone , robaxin , tylenol    Subjective:  Chief Complaint:left knee pain.  HPI: Dawn Dean, 63 y.o. female, She had her left knee replaced initially 11 years ago in [place]. She began having issues with the knee several years ago. She had a bone scan in 2022 that indicated loosening of her components.    Patient Active Problem List   Diagnosis Date Noted   Routine general medical examination at a health care facility 08/27/2015   Type 2 diabetes mellitus with hyperglycemia (HCC) 07/05/2015   Special screening for malignant neoplasms, colon    Vaginal dryness 04/25/2015   Muscle spasms of neck 04/25/2015   Acute upper respiratory infection 01/07/2015   Rash and nonspecific skin eruption 12/10/2014   Hypothyroidism 11/11/2014   Hyperlipidemia 11/11/2014   Hx of substance abuse (HCC) 10/20/2014   Need for influenza vaccination 10/20/2014   Dysuria 10/20/2014   Obesity (BMI 30-39.9) 10/19/2014   Past Medical History:  Diagnosis Date   Arthritis    NECK AND KNEE   Diabetes mellitus without complication (HCC)    TYPE II /diagnosed -20 yrs. ago    Hypothyroidism    Mental disorder     Past Surgical History:  Procedure Laterality Date   ANTERIOR CERVICAL DECOMP/DISCECTOMY FUSION  12/05/2012   Procedure: ANTERIOR CERVICAL DECOMPRESSION/DISCECTOMY FUSION 1 LEVEL;  Surgeon: Donaciano Sprang, MD;  Location: [facility] OR;  Service: Orthopedics;  Laterality: [name];  ACDF C5-6   CERVICAL DISCECTOMY  12/05/2012   CESAREAN SECTION      1985   COLONOSCOPY WITH PROPOFOL  [name] 06/03/2015   Procedure: COLONOSCOPY WITH PROPOFOL ;  Surgeon: Rogelia Copping, MD;  Location: [facility];  Service: Endoscopy;  Laterality: [name];   DILATION AND CURETTAGE OF UTERUS     JOINT REPLACEMENT Left    APPROX 7 OR 8 YRS AGO   KNEE ARTHROSCOPY Left    right rotator cuff surgery      sinus surgery     for deviated septum    No current facility-administered medications for this encounter.   Current Outpatient Medications  Medication Sig Dispense Refill Last Dose/Taking   aspirin EC 81 MG tablet Take 81 mg by mouth in the morning. Swallow whole.   Taking   atorvastatin  (LIPITOR) 20 MG tablet Take 20 mg by mouth at bedtime.   Taking   citalopram  (CELEXA ) 20 MG tablet Take 20 mg by mouth at bedtime.   Taking   empagliflozin  (JARDIANCE ) 10 MG TABS tablet Take 10 mg by mouth daily.   Taking   estradiol (ESTRACE) 0.1 MG/GM vaginal cream Place 1 Applicatorful vaginally 3 (three) times a week.   Taking   levothyroxine  (SYNTHROID , LEVOTHROID) 100 MCG tablet TAKE 1 TABLET (100 MCG TOTAL) BY MOUTH DAILY BEFORE BREAKFAST. 90 tablet 0 Taking   metFORMIN  (GLUCOPHAGE ) 1000 MG tablet TAKE 1 TABLET BY MOUTH TWICE A DAY WITH A MEAL 60 tablet 0 Taking   Multiple Vitamin (MULTIVITAMIN WITH MINERALS) TABS tablet Take 1 tablet by mouth daily.   Taking   omeprazole (PRILOSEC OTC) 20 MG tablet Take 20 mg by mouth in  the morning and at bedtime.   Taking   ondansetron  (ZOFRAN ) 4 MG tablet Take 4 mg by mouth every 8 (eight) hours as needed for nausea or vomiting.   Taking As Needed   pregabalin (LYRICA) 150 MG capsule Take 150 mg by mouth 2 (two) times daily.   Taking   QUEtiapine  (SEROQUEL ) 100 MG tablet TAKE 1 TABLET (100 MG TOTAL) BY MOUTH AT BEDTIME. 90 tablet 2 Taking   tirzepatide [facility]) 15 MG/0.5ML Pen Inject 15 mg into the skin every Sunday.   Taking   Allergies  Allergen Reactions   Cymbalta [Duloxetine Hcl] Other (See Comments)    Double Vision     Propoxyphene Nausea Only    Social History   Tobacco Use   Smoking status: Former    Current packs/day: 0.00    Average packs/day: 1.5 packs/day for 10.0 years (15.0 ttl pk-yrs)    Types: Cigarettes    Start date: 12/06/1983    Quit date: 12/05/1993    Years since quitting: 30.7   Smokeless tobacco: Never  Substance Use Topics   Alcohol use: No    Alcohol/week: 0.0 standard drinks of alcohol    Comment: very seldom     Family History  Problem Relation Age of Onset   Heart disease Other    Diabetes Cousin    Breast cancer Mother 61       again at 29      Review of Systems  Constitutional:  Negative for chills and fever.  Respiratory:  Negative for cough and shortness of breath.   Cardiovascular:  Negative for chest pain.  Gastrointestinal:  Negative for nausea and vomiting.  Musculoskeletal:  Positive for arthralgias.      Objective:  Physical Exam Left knee exam: Her surgical incision remains well-healed without erythema or warmth She has tenderness to palpation over the medial aspect knee predominantly, more so over the proximal tibia No significant flexion contracture Flexion over 100 degrees Stable medial and lateral collateral ligament  Right knee exam: No palpable effusion, warmth or erythema Tenderness medially Slight flexion contracture with flexion close to 120 degrees Stable medial and lateral collateral ligaments  Vital signs in last 24 hours:    Labs:  Estimated body mass index is 27.76 kg/m as calculated from the following:   Height as of 08/19/24: 5' 6 (1.676 m).   Weight as of 08/19/24: 78 kg.  Imaging Review  Imaging: Standing AP and lateral both knees were ordered and reviewed. These radiographs reveal that she has advanced right knee osteoarthritis affecting the medial and patellofemoral compartments with subchondral sclerosis and osteophytes. Left knee exam reveals that her femoral and tibial components appear to be relatively stable with  regards to orientation. Mild lucent lines present particularly over the medial aspect of the tibial tray.  Bone scan was performed on 11/11/2021. We reviewed the report today which had supportive concerns for loosening of the tibial tray and medial femoral compartment with regards to increased uptake.   Assessment/Plan:   left knee(s) with failed previous arthroplasty.   The patient history, physical examination, clinical judgment of the provider and imaging studies are consistent with end stage degenerative joint disease of the left knee(s), previous total knee arthroplasty. Revision total knee arthroplasty is deemed medically necessary. The treatment options including medical management, injection therapy, arthroscopy and revision arthroplasty were discussed at length. The risks and benefits of revision total knee arthroplasty were presented and reviewed. The risks due to aseptic loosening, infection, stiffness, patella  tracking problems, thromboembolic complications and other imponderables were discussed. The patient acknowledged the explanation, agreed to proceed with the plan and consent was signed. Patient is being admitted for inpatient treatment for surgery, pain control, PT, OT, prophylactic antibiotics, VTE prophylaxis, progressive ambulation and ADL's and discharge planning.The patient is planning to be discharged home.  Rosina Calin, PA-C Orthopedic Surgery [facility] [phone]

## 2024-08-26 NOTE — Progress Notes (Signed)
 PT Note  Patient Details Name: Dawn Dean MRN: [id number] DOB: 06-Sep-1961   Cancelled Treatment:    Reason Eval/Treat Not Completed: Pain limiting ability to participate. PT arrived 63. Pt reports just having pain medication and 6/10 L knee pain. Pt politely requesting to defer PT eval until Wednesday. PT to continue to follow acutely.   [place], PT Acute Rehab   [place] VEAR [place]one 08/26/2024, 6:45 PM

## 2024-08-26 NOTE — Plan of Care (Signed)

## 2024-08-26 NOTE — Anesthesia Procedure Notes (Signed)
 Anesthesia Regional Block: Adductor canal block   Pre-Anesthetic Checklist: , timeout performed,  Correct Patient, Correct Site, Correct Laterality,  Correct Procedure, Correct Position, site marked,  Risks and benefits discussed,  Surgical consent,  Pre-op evaluation,  At surgeon's request and post-op pain management  Laterality: Left  Prep: chloraprep       Needles:  Injection technique: Single-shot  Needle Type: Echogenic Needle     Needle Length: 9cm  Needle Gauge: 21     Additional Needles:   Narrative:  Start time: 08/26/2024 12:18 PM End time: 08/26/2024 12:25 PM Injection made incrementally with aspirations every 5 mL.  Performed by: Personally  Anesthesiologist: Maryclare Cornet, MD  Additional Notes: Pt tolerated the procedure well.

## 2024-08-26 NOTE — Op Note (Signed)
 NAMEAKANKSHA, BELLMORE MEDICAL RECORD NO: [id number] ACCOUNT NO: [id number] DATE OF BIRTH: [birth year] FACILITY: [facility] LOCATION: [id number] PHYSICIAN: Donnice BIRCH. 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  PROCEDURE:  Revision left total knee arthroplasty.  COMPONENTS USED:  Depuy Attune knee revision system with a size 4 left revision femoral component with a 4 mm posteromedial augment, 4 mm medial and lateral distal augments, small press-fit cone with a 14 x 80 cemented stem.  On the tibial side, we used a size  3 Attune revision tibial tray with a 14 x 50 cemented stem and a 29 press-fit sleeve.  We used a size 8 Attune revision polyethylene insert to match the size 4 femur.  The patella was not revised due to concerns for remained bone stock.  SURGEON:  Donnice BIRCH. Ernie, MD  ASSISTANT:  Rosina Calin, PA-C.  Note that PA Calin was present for the entirety of the case from preoperative positioning, perioperative management of the operative extremity, general facilitation of the case, and primary wound closure.  ANESTHESIA:  General.  BLOOD LOSS:  Approximately 300 mL.  TOURNIQUET:  Tourniquet was up at 225 mmHg for approximately 57 minutes.  DRAINS:  None.  COMPLICATIONS:  None.  INDICATIONS FOR THE PROCEDURE:  The patient is a 63 year old female who had presented for evaluation of bilateral knee pain.  She is noted to have advanced right knee osteoarthritis.  She has a history of left total knee replacement performed about 10 years ago.  She  has had persistent pain with activities.  Previous workup revealed concerns for aseptic loosening.  Based on the pain in both of her knees, a discussion was had in order to try to figure out the best way to manage these conditions.  She wished to proceed   with her left knee first.  She wanted to have her left knee revised in an attempt to try to improve her pain, as she states that she has had pain in this knee for a long time at this point.  Her clinical and radiographic workup were negative for any concern for infection.  Based on her  persistent symptoms and the effect on her quality of life, we discussed total knee revision surgery.  Risks include infection, DVT, component failure, stiffness, and need for future surgery as well as persistent pain despite the revision surgery.  The  postoperative course and effort required to maximize her functional return was reviewed and then based on her prior history.  Consent was obtained for the benefit of pain relief.  DESCRIPTION OF PROCEDURE:  The patient was brought to the operative theater.  Once adequate anesthesia, preoperative antibiotics, Ancef  administered as well as tranexamic acid and Decadron , she was positioned supine with a left thigh tourniquet placed.   The left lower extremity was then prepped and draped in sterile fashion.  A timeout was performed identifying the patient, planned procedure and extremity.  Her old incision was excised and extended slightly proximal and distal.  Soft tissue planes were  created.  Medial arthrotomy was then made encountering clear yellow synovial fluid.  At this point, we proceeded with a significant synovectomy and scar debridement including the medial, lateral, and suprapatellar regions.  We focused around the  parapatellar region for exposure extending the quadriceps incision slightly in order to get the patella  to subluxate laterally.  The old polyethylene was removed.  I used a thin ACL saw and was able to easily elevate the tibial tray.  We then focused on  the femoral component.  I did prior to this used a Land in order to subluxate the tibia anteriorly so as to avoid injury to her exposed distal femur.  The femoral bone cement interface was  again undermined using the thin ACL saw.  The femoral component was removed without bone loss.  Once this was done, we subluxated the tibia anteriorly and removed the tibial tray.  Following removal of the remaining cement, I reamed the distal femur and proximal tibia up to 16 mm.  This would allow for 14 mm cemented stems.  Once this was done, we used  an extramedullary guide and freshened up the cut on the proximal tibia removing excessive sclerotic bone and cement.  I then broached the tibia just to the 29 broach.  With this in place and slightly prominent off her proximal tibia cut, we placed a size 3  tibial tray.  On the femoral side, I sized the femur to be a size 4.  We revisited the distal femoral cut removing a small amount of bone on each medial and lateral aspect of the femur.  I then placed the rotation block into the distal femur and set  rotation off the proximal tibia cut and component.  The anterior and posterior cuts were performed.  We determined we would use a 4 mm posteromedial augment at this point.  Posterior chamfer cut was made.  Then, the subsequent jig was placed for the  anterior chamfer and box cut.  Once this was done, we removed the cutting blocks and placed a size 4 femoral revision component with a 4 mm posteromedial augment in place.  We determined that the 6 mm insert looked good in flexion but she was noted to have slight hyperextension.  For that reason, we did elected to use 4 mm augments on the medial and lateral aspect of the distal femoral component as well.  Given all these findings, the trial components were removed.  Prior to placement of the final components I did prepare the distal femur for a small press-fit cone to augment fixation.  Cement restrictors were measured and placed into the distal femur and  proximal tibia.  The final components were opened and configured on the back table under direct supervision.  We irrigated the knee with normal saline solution as  well as Prontosan antimicrobial solution.  Once the components were made, the final cement  was mixed.  We did a hybrid technique on the tibial side placing some cement into the distal aspect of the proximal tibial preparation.  We did impact the cone for the distal femur after preparing for it.  A small amount of cement was placed into the  proximal aspect of the tibia to have a hybrid fixation.  The component was then impacted with good fixation.  The femoral component was cemented into the distal femur and cone.  The knee was brought to extension with an 8-mm insert.  Once the cement  fully cured, we confirmed that the size 8 mm insert provided the best stability from extension to flexion.  The final 8-mm Attune revision insert was opened and placed into the tibia.  The tourniquet was let down after 57 minutes.  There was no  significant hemostasis required.  The knee was irrigated with normal saline solution  and Prontosan antimicrobial solution.  Once this was done, the knee was brought to about 40 degrees of flexion.  The extensor mechanism was reapproximated using #1  Vicryl and #1 Stratafix suture.  The remainder of the wound was closed with 2-0 Vicryl and a running Monocryl stitch.  The wound was cleaned, dried, and dressed sterilely using surgical glue and Aquacel dressing.  The patient was brought to the recovery  room in stable condition, tolerating the procedure well.  Postoperatively, she will be allowed to be weightbearing as tolerated.  She will work on range of motion immediately.  Upon discharge, she will return to the office to see us  in 2 weeks.   PUS D: 08/26/2024 3:16:08 pm T: 08/26/2024 5:34:00 pm  JOB: [id number]/ [id number]

## 2024-08-26 NOTE — Discharge Instructions (Signed)

## 2024-08-26 NOTE — Interval H&P Note (Signed)
 History and Physical Interval Note:  08/26/2024 11:46 AM  Dawn Dean  has presented today for surgery, with the diagnosis of Failed left total knee arthroplasty.  The various methods of treatment have been discussed with the patient and family. After consideration of risks, benefits and other options for treatment, the patient has consented to  Procedure(s): TOTAL KNEE REVISION (Left) as a surgical intervention.  The patient's history has been reviewed, patient examined, no change in status, stable for surgery.  I have reviewed the patient's chart and labs.  Questions were answered to the patient's satisfaction.     Dawn Dean

## 2024-08-27 ENCOUNTER — Encounter (HOSPITAL_COMMUNITY): Payer: Self-pay | Admitting: Orthopedic Surgery

## 2024-08-27 DIAGNOSIS — T84033D Mechanical loosening of internal left knee prosthetic joint, subsequent encounter: Secondary | ICD-10-CM | POA: Diagnosis not present

## 2024-08-27 LAB — BASIC METABOLIC PANEL WITH GFR
Anion gap: 14 (ref 5–15)
BUN: 29 mg/dL — ABNORMAL HIGH (ref 8–23)
CO2: 22 mmol/L (ref 22–32)
Calcium: 8.5 mg/dL — ABNORMAL LOW (ref 8.9–10.3)
Chloride: 99 mmol/L (ref 98–111)
Creatinine, Ser: 1.05 mg/dL — ABNORMAL HIGH (ref 0.44–1.00)
GFR, Estimated: 59 mL/min — ABNORMAL LOW (ref >60)
Glucose, Bld: 270 mg/dL — ABNORMAL HIGH (ref 70–99)
Potassium: 4.4 mmol/L (ref 3.5–5.1)
Sodium: 135 mmol/L (ref 135–145)

## 2024-08-27 LAB — CBC
HCT: 29.6 % — ABNORMAL LOW (ref 36.0–46.0)
Hemoglobin: 9.3 g/dL — ABNORMAL LOW (ref 12.0–15.0)
MCH: 29 pg (ref 26.0–34.0)
MCHC: 31.4 g/dL (ref 30.0–36.0)
MCV: 92.2 fL (ref 80.0–100.0)
Platelets: 80 K/uL — ABNORMAL LOW (ref 150–400)
RBC: 3.21 MIL/uL — ABNORMAL LOW (ref 3.87–5.11)
RDW: 13.7 % (ref 11.5–15.5)
WBC: 6 K/uL (ref 4.0–10.5)
nRBC: 0 % (ref 0.0–0.2)

## 2024-08-27 LAB — GLUCOSE, CAPILLARY: Glucose-Capillary: 202 mg/dL — ABNORMAL HIGH (ref 70–99)

## 2024-08-27 MED ORDER — SENNA 8.6 MG PO TABS: 2.0000 | ORAL_TABLET | Freq: Every day | ORAL | 0 refills | Status: AC

## 2024-08-27 MED ORDER — POLYETHYLENE GLYCOL 3350 17 G PO PACK: 17.0000 g | PACK | Freq: Two times a day (BID) | ORAL | 0 refills | Status: AC

## 2024-08-27 MED ORDER — OXYCODONE HCL 5 MG PO TABS: 5.0000 mg | ORAL_TABLET | ORAL | 0 refills | Status: AC | PRN

## 2024-08-27 MED ORDER — ASPIRIN 81 MG PO CHEW: 81.0000 mg | CHEWABLE_TABLET | Freq: Two times a day (BID) | ORAL | 0 refills | Status: AC

## 2024-08-27 MED ORDER — METHOCARBAMOL 500 MG PO TABS: 500.0000 mg | ORAL_TABLET | Freq: Four times a day (QID) | ORAL | 2 refills | Status: AC | PRN

## 2024-08-27 NOTE — TOC Transition Note (Signed)
 Transition of Care [facility]) - Discharge Note   Patient Details  Name: Dawn Dean MRN: [id number] Date of Birth: 22-Jul-1961  Transition of Care [facility] CM/SW Contact:  NORMAN ASPEN, LCSW Phone Number: 08/27/2024, 10:42 AM   Clinical Narrative:     Met with pt who confirms need for RW and no DME agency preference.  RW ordered via [employer] and item delivered to room.  OPPT already arranged with [facility]).  No further IP CM needs.  Final next level of care: OP Rehab Barriers to Discharge: No Barriers Identified   Patient Goals and CMS Choice Patient states their goals for this hospitalization and ongoing recovery are:: return home          Discharge Placement                       Discharge Plan and Services Additional resources added to the After Visit Summary for                  DME Arranged: Walker rolling DME Agency: [employer] Date DME Agency Contacted: 08/27/24 Time DME Agency Contacted: [phone] Representative spoke with at DME Agency: [place]            Social Drivers of Health (SDOH) Interventions SDOH Screenings   Food Insecurity: No Food Insecurity (08/26/2024)  Housing: Low Risk  (08/26/2024)  Transportation Needs: No Transportation Needs (08/26/2024)  Utilities: Not At Risk (08/26/2024)  Financial Resource Strain: Low Risk  (08/21/2023)   Received from [facility]  Social Connections: Moderately Isolated (08/26/2024)  Tobacco Use: Medium Risk (08/26/2024)     Readmission Risk Interventions     No data to display

## 2024-08-27 NOTE — Plan of Care (Signed)
  Problem: Pain Management: Goal: Pain level will decrease with appropriate interventions Outcome: Progressing   Problem: Clinical Measurements: Goal: Postoperative complications will be avoided or minimized Outcome: Progressing   Problem: Activity: Goal: Range of joint motion will improve Outcome: Progressing   Problem: Education: Goal: Knowledge of the prescribed therapeutic regimen will improve Outcome: Progressing   Problem: Coping: Goal: Level of anxiety will decrease Outcome: Progressing

## 2024-08-27 NOTE — Progress Notes (Signed)
 Patient ID: Dawn Dean, female   DOB: 1961-07-25, 63 y.o.   MRN: [id number] Subjective: 1 Day Post-Op Procedure(s) (LRB): TOTAL KNEE REVISION (Left)    Patient reports pain as mild. No events overnight Using ice machine  Objective:   VITALS:   Vitals:   08/27/24 0213 08/27/24 0609  BP: (!) 100/56 (!) 107/54  Pulse: 70 70  Resp: 18 19  Temp: 98.3 F (36.8 C) 97.8 F (36.6 C)  SpO2: 97% 97%    Neurovascular intact Incision: dressing C/D/I  LABS Recent Labs    08/27/24 0330  HGB 9.3*  HCT 29.6*  WBC 6.0  PLT 80*    Recent Labs    08/27/24 0330  NA 135  K 4.4  BUN 29*  CREATININE 1.05*  GLUCOSE 270*    No results for input(s): LABPT, INR in the last 72 hours.   Assessment/Plan: 1 Day Post-Op Procedure(s) (LRB): TOTAL KNEE REVISION (Left)   Advance diet Up with therapy Reviewed intra-operative findings and the procedure Reviewed goals RTC in 2 weeks Will likely be able to go home today if she does well with PT

## 2024-08-27 NOTE — Evaluation (Signed)
 Physical Therapy Evaluation Patient Details Name: Dawn Dean MRN: [id number] DOB: May 12, 1961 Today's Date: 08/27/2024  History of Present Illness  63 y.o. female admitted 08/26/24 for L TKA revision. PMH: DM, substance abuse, obesity, ACDF C5-6  Clinical Impression  Pt is mobilizing well, she ambulated 120' with RW, no loss of balance, no buckling of L knee. Stair training completed. Pt demonstrates good understanding of TKA HEP. She is ready to DC home from a PT standpoint.        If plan is discharge home, recommend the following: Help with stairs or ramp for entrance;Assist for transportation;Assistance with cooking/housework   Can travel by private vehicle        Equipment Recommendations Rolling walker (2 wheels)  Recommendations for Other Services       Functional Status Assessment Patient has had a recent decline in their functional status and demonstrates the ability to make significant improvements in function in a reasonable and predictable amount of time.     Precautions / Restrictions Precautions Precautions: Knee Precaution Booklet Issued: Yes (comment) Recall of Precautions/Restrictions: Intact Precaution/Restrictions Comments: reviewed no pillow under knee Restrictions Weight Bearing Restrictions Per Provider Order: No LLE Weight Bearing Per Provider Order: Weight bearing as tolerated      Mobility  Bed Mobility Overal bed mobility: Modified Independent             General bed mobility comments: HOB up, used rail    Transfers Overall transfer level: Needs assistance Equipment used: Rolling walker (2 wheels) Transfers: Sit to/from Stand Sit to Stand: Supervision           General transfer comment: VCs hand placement    Ambulation/Gait Ambulation/Gait assistance: Supervision Gait Distance (Feet): 120 Feet Assistive device: Rolling walker (2 wheels) Gait Pattern/deviations: Step-to pattern, Decreased step length - right, Decreased  step length - left Gait velocity: decr     General Gait Details: VCs sequencing and to roll rather than lift RW; no loss of balance, no buckling  Stairs Stairs: Yes Stairs assistance: Supervision Stair Management: Sideways, One rail Right, Step to pattern, Forwards Number of Stairs: 3 General stair comments: VCs sequencing, pt did stairs forwards with 1 rail and HHA, then sideways with 1 rail. She preferred sideways.  Wheelchair Mobility     Tilt Bed    Modified Rankin (Stroke Patients Only)       Balance Overall balance assessment: Modified Independent                                           Pertinent Vitals/Pain Pain Assessment Pain Assessment: 0-10 Pain Score: 6  Pain Location: L knee with walking Pain Descriptors / Indicators: Sore, Operative site guarding Pain Intervention(s): Limited activity within patient's tolerance, Monitored during session, Premedicated before session, Ice applied, Repositioned    Home Living Family/patient expects to be discharged to:: (P) Private residence Living Arrangements: (P) Spouse/significant other Available Help at Discharge: (P) Family;Available 24 hours/day Type of Home: (P) House Home Access: (P) Stairs to enter Entrance Stairs-Rails: (P) Left;Right Entrance Stairs-Number of Steps: (P) 4   Home Layout: (P) One level Home Equipment: (P) Grab bars - tub/shower      Prior Function Prior Level of Function : (P) Independent/Modified Independent;Driving             Mobility Comments: (P) walks without AD, no falls in past 6 months ADLs  Comments: (P) independent     Extremity/Trunk Assessment   Upper Extremity Assessment Upper Extremity Assessment: Overall WFL for tasks assessed    Lower Extremity Assessment Lower Extremity Assessment: LLE deficits/detail LLE Deficits / Details: SLR 3/5, knee ext at least 3/5, knee AAROM ~0-85* LLE Sensation: WNL LLE Coordination: WNL    Cervical / Trunk  Assessment Cervical / Trunk Assessment: Normal  Communication   Communication Communication: No apparent difficulties    Cognition Arousal: Alert Behavior During Therapy: WFL for tasks assessed/performed   PT - Cognitive impairments: No apparent impairments                         Following commands: Intact       Cueing Cueing Techniques: Verbal cues     General Comments      Exercises Total Joint Exercises Ankle Circles/Pumps: AROM, Both, 10 reps, Supine Quad Sets: AROM, Both, 5 reps, Supine Short Arc Quad: AROM, Left, 5 reps, Supine Heel Slides: AAROM, Left, 5 reps, Supine Hip ABduction/ADduction: AROM, Left, 5 reps, Supine Straight Leg Raises: AROM, Left, 5 reps, Supine Long Arc Quad: AROM, Left, 5 reps, Seated Knee Flexion: AAROM, Left, 5 reps, Seated Goniometric ROM: 0-85* L knee AAROM   Assessment/Plan    PT Assessment All further PT needs can be met in the next venue of care  PT Problem List Decreased strength;Decreased range of motion;Decreased mobility;Pain       PT Treatment Interventions      PT Goals (Current goals can be found in the Care Plan section)  Acute Rehab PT Goals Patient Stated Goal: to be able to walk 10-15 years from now PT Goal Formulation: All assessment and education complete, DC therapy    Frequency       Co-evaluation               AM-PAC PT 6 Clicks Mobility  Outcome Measure Help needed turning from your back to your side while in a flat bed without using bedrails?: None Help needed moving from lying on your back to sitting on the side of a flat bed without using bedrails?: None Help needed moving to and from a bed to a chair (including a wheelchair)?: None Help needed standing up from a chair using your arms (e.g., wheelchair or bedside chair)?: None Help needed to walk in hospital room?: None Help needed climbing 3-5 steps with a railing? : A Little 6 Click Score: 23    End of Session Equipment  Utilized During Treatment: Gait belt Activity Tolerance: Patient tolerated treatment well Patient left: in chair;with chair alarm set;with call bell/phone within reach Nurse Communication: Mobility status PT Visit Diagnosis: Difficulty in walking, not elsewhere classified (R26.2);Pain Pain - Right/Left: Left Pain - part of body: Knee    Time: 9094-9060 PT Time Calculation (min) (ACUTE ONLY): 34 min   Charges:   PT Evaluation $PT Eval Moderate Complexity: 1 Mod PT Treatments $Gait Training: 8-22 mins PT General Charges $$ ACUTE PT VISIT: 1 Visit         Sylvan Delon Copp PT 08/27/2024  Acute Rehabilitation Services  Office [phone]

## 2024-08-29 NOTE — Anesthesia Postprocedure Evaluation (Signed)
 Anesthesia Post Note  Patient: Dawn Dean  Procedure(s) Performed: TOTAL KNEE REVISION (Left: Knee)     Patient location during evaluation: PACU Anesthesia Type: General and Regional Level of consciousness: awake and alert Pain management: pain level controlled Vital Signs Assessment: post-procedure vital signs reviewed and stable Respiratory status: spontaneous breathing, nonlabored ventilation, respiratory function stable and patient connected to nasal cannula oxygen Cardiovascular status: blood pressure returned to baseline and stable Postop Assessment: no apparent nausea or vomiting Anesthetic complications: no   No notable events documented.  Last Vitals:  Vitals:   08/27/24 0213 08/27/24 0609  BP: (!) 100/56 (!) 107/54  Pulse: 70 70  Resp: 18 19  Temp: 36.8 C 36.6 C  SpO2: 97% 97%    Last Pain:  Vitals:   08/27/24 1106  TempSrc:   PainSc: 6                  Shamieka Gullo S

## 2024-09-02 NOTE — Discharge Summary (Signed)
 Patient ID: Dawn Dean MRN: [id number] DOB/AGE: [birth year] 63 y.o.  Admit date: 08/26/2024 Discharge date: 08/27/2024  Admission Diagnoses:  Failed left total knee arthroplasty   Discharge Diagnoses:  Principal Problem:   S/P revision of total knee, left   Past Medical History:  Diagnosis Date   Arthritis    NECK AND KNEE   Diabetes mellitus without complication (HCC)    TYPE II /diagnosed -20 yrs. ago    Hypothyroidism    Mental disorder     Surgeries: Procedure(s): TOTAL KNEE REVISION on 08/26/2024   Consultants:   Discharged Condition: Improved  Hospital Course: Dawn Dean is an 63 y.o. female who was admitted 08/26/2024 for operative treatment ofS/P revision of total knee, left. Patient has severe unremitting pain that affects sleep, daily activities, and work/hobbies. After pre-op clearance the patient was taken to the operating room on 08/26/2024 and underwent  Procedure(s): TOTAL KNEE REVISION.    Patient was given perioperative antibiotics:  Anti-infectives (From admission, onward)    Start     Dose/Rate Route Frequency Ordered Stop   08/26/24 1900  ceFAZolin  (ANCEF ) IVPB 2g/100 mL premix        2 g 200 mL/hr over 30 Minutes Intravenous Every 6 hours 08/26/24 1742 08/27/24 0041   08/26/24 1115  ceFAZolin  (ANCEF ) IVPB 2g/100 mL premix        2 g 200 mL/hr over 30 Minutes Intravenous On call to O.R. 08/26/24 1021 08/26/24 1338        Patient was given sequential compression devices, early ambulation, and chemoprophylaxis to prevent DVT. Patient worked with PT and was meeting their goals regarding safe ambulation and transfers.  Patient benefited maximally from hospital stay and there were no complications.    Recent vital signs: No data found.   Recent laboratory studies: No results for input(s): WBC, HGB, HCT, PLT, NA, K, CL, CO2, BUN, CREATININE, GLUCOSE, INR, CALCIUM  in the last 72 hours.  Invalid input(s): PT,  2   Discharge Medications:   Allergies as of 08/27/2024       Reactions   Cymbalta [duloxetine Hcl] Other (See Comments)   Double Vision    Propoxyphene Nausea Only        Medication List     STOP taking these medications    aspirin EC 81 MG tablet Replaced by: aspirin 81 MG chewable tablet       TAKE these medications    aspirin 81 MG chewable tablet Chew 1 tablet (81 mg total) by mouth 2 (two) times daily for 28 days. Replaces: aspirin EC 81 MG tablet   atorvastatin  20 MG tablet Commonly known as: LIPITOR Take 20 mg by mouth at bedtime.   citalopram  20 MG tablet Commonly known as: CELEXA  Take 20 mg by mouth at bedtime.   estradiol 0.1 MG/GM vaginal cream Commonly known as: ESTRACE Place 1 Applicatorful vaginally 3 (three) times a week.   Jardiance  10 MG Tabs tablet Generic drug: empagliflozin  Take 10 mg by mouth daily.   levothyroxine  100 MCG tablet Commonly known as: SYNTHROID  TAKE 1 TABLET (100 MCG TOTAL) BY MOUTH DAILY BEFORE BREAKFAST.   metFORMIN  1000 MG tablet Commonly known as: GLUCOPHAGE  TAKE 1 TABLET BY MOUTH TWICE A DAY WITH A MEAL   methocarbamol  500 MG tablet Commonly known as: ROBAXIN  Take 1 tablet (500 mg total) by mouth every 6 (six) hours as needed for muscle spasms.   Mounjaro 15 MG/0.5ML Pen Generic drug: tirzepatide Inject 15 mg into the skin every  Sunday.   multivitamin with minerals Tabs tablet Take 1 tablet by mouth daily.   omeprazole 20 MG tablet Commonly known as: PRILOSEC OTC Take 20 mg by mouth in the morning and at bedtime.   ondansetron  4 MG tablet Commonly known as: ZOFRAN  Take 4 mg by mouth every 8 (eight) hours as needed for nausea or vomiting.   oxyCODONE  5 MG immediate release tablet Commonly known as: Oxy IR/ROXICODONE  Take 1 tablet (5 mg total) by mouth every 4 (four) hours as needed for severe pain (pain score 7-10).   polyethylene glycol 17 g packet Commonly known as: MIRALAX  / GLYCOLAX  Take 17 g  by mouth 2 (two) times daily.   pregabalin 150 MG capsule Commonly known as: LYRICA Take 150 mg by mouth 2 (two) times daily.   QUEtiapine  100 MG tablet Commonly known as: SEROQUEL  TAKE 1 TABLET (100 MG TOTAL) BY MOUTH AT BEDTIME.   senna 8.6 MG Tabs tablet Commonly known as: SENOKOT Take 2 tablets (17.2 mg total) by mouth at bedtime for 14 days.               Discharge Care Instructions  (From admission, onward)           Start     Ordered   08/27/24 0000  Change dressing       Comments: Maintain surgical dressing until follow up in the clinic. If the edges start to pull up, may reinforce with tape. If the dressing is no longer working, may remove and cover with gauze and tape, but must keep the area dry and clean.  Call with any questions or concerns.   08/27/24 0831            Diagnostic Studies: No results found.  Disposition: Discharge disposition: 01-Home or Self Care       Discharge Instructions     Call MD / Call 911   Complete by: As directed    If you experience chest pain or shortness of breath, CALL 911 and be transported to the hospital emergency room.  If you develope a fever above 101 F, pus (white drainage) or increased drainage or redness at the wound, or calf pain, call your surgeon's office.   Change dressing   Complete by: As directed    Maintain surgical dressing until follow up in the clinic. If the edges start to pull up, may reinforce with tape. If the dressing is no longer working, may remove and cover with gauze and tape, but must keep the area dry and clean.  Call with any questions or concerns.   Constipation Prevention   Complete by: As directed    Drink plenty of fluids.  Prune juice may be helpful.  You may use a stool softener, such as Colace (over the counter) 100 mg twice a day.  Use MiraLax  (over the counter) for constipation as needed.   Diet - low sodium heart healthy   Complete by: As directed    Increase activity  slowly as tolerated   Complete by: As directed    Weight bearing as tolerated with assist device (walker, cane, etc) as directed, use it as long as suggested by your surgeon or therapist, typically at least 4-6 weeks.   Post-operative opioid taper instructions:   Complete by: As directed    POST-OPERATIVE OPIOID TAPER INSTRUCTIONS: It is important to wean off of your opioid medication as soon as possible. If you do not need pain medication after your surgery it is  ok to stop day one. Opioids include: Codeine, Hydrocodone(Norco, Vicodin), Oxycodone (Percocet, oxycontin ) and hydromorphone  amongst others.  Long term and even short term use of opiods can cause: Increased pain response Dependence Constipation Depression Respiratory depression And more.  Withdrawal symptoms can include Flu like symptoms Nausea, vomiting And more Techniques to manage these symptoms Hydrate well Eat regular healthy meals Stay active Use relaxation techniques(deep breathing, meditating, yoga) Do Not substitute Alcohol to help with tapering If you have been on opioids for less than two weeks and do not have pain than it is ok to stop all together.  Plan to wean off of opioids This plan should start within one week post op of your joint replacement. Maintain the same interval or time between taking each dose and first decrease the dose.  Cut the total daily intake of opioids by one tablet each day Next start to increase the time between doses. The last dose that should be eliminated is the evening dose.      TED hose   Complete by: As directed    Use stockings (TED hose) for 2 weeks on both leg(s).  You may remove them at night for sleeping.        Follow-up Information     Ernie Cough, MD. Schedule an appointment as soon as possible for a visit in 2 week(s).   Specialty: Orthopedic Surgery Contact information: [address] [place] [address] [place] [address] [phone]                   Signed: Rosina JONELLE Calin 09/02/2024, 7:09 AM
